# Patient Record
Sex: Female | Born: 1935 | Race: Black or African American | Hispanic: No | Smoking: Never smoker
Health system: Southern US, Community
[De-identification: ages and names within clinical notes are randomized; demographics above are authoritative.]

## PROBLEM LIST (undated history)

## (undated) DIAGNOSIS — M1990 Unspecified osteoarthritis, unspecified site: Secondary | ICD-10-CM

## (undated) DIAGNOSIS — Z8709 Personal history of other diseases of the respiratory system: Secondary | ICD-10-CM

## (undated) DIAGNOSIS — H409 Unspecified glaucoma: Secondary | ICD-10-CM

## (undated) DIAGNOSIS — K5732 Diverticulitis of large intestine without perforation or abscess without bleeding: Secondary | ICD-10-CM

## (undated) DIAGNOSIS — I10 Essential (primary) hypertension: Secondary | ICD-10-CM

## (undated) DIAGNOSIS — R32 Unspecified urinary incontinence: Secondary | ICD-10-CM

## (undated) DIAGNOSIS — J302 Other seasonal allergic rhinitis: Secondary | ICD-10-CM

## (undated) DIAGNOSIS — R3915 Urgency of urination: Secondary | ICD-10-CM

## (undated) HISTORY — PX: ABDOMINAL HYSTERECTOMY: SHX81

## (undated) HISTORY — PX: COLONOSCOPY: SHX174

## (undated) HISTORY — PX: SAVORY DILATION: SHX5439

## (undated) HISTORY — DX: Essential (primary) hypertension: I10

## (undated) HISTORY — PX: DILATION AND CURETTAGE OF UTERUS: SHX78

## (undated) HISTORY — DX: Diverticulitis of large intestine without perforation or abscess without bleeding: K57.32

---

## 2016-07-09 ENCOUNTER — Other Ambulatory Visit: Payer: Self-pay | Admitting: Orthopedic Surgery

## 2016-07-09 DIAGNOSIS — R0989 Other specified symptoms and signs involving the circulatory and respiratory systems: Secondary | ICD-10-CM

## 2016-07-17 ENCOUNTER — Ambulatory Visit
Admission: RE | Admit: 2016-07-17 | Discharge: 2016-07-17 | Disposition: A | Discharge status: Home / Self Care | Payer: Medicare Other | Source: Ambulatory Visit | Attending: Orthopedic Surgery | Admitting: Orthopedic Surgery

## 2016-07-17 DIAGNOSIS — R0989 Other specified symptoms and signs involving the circulatory and respiratory systems: Secondary | ICD-10-CM

## 2016-07-27 ENCOUNTER — Other Ambulatory Visit: Payer: Self-pay | Admitting: *Deleted

## 2016-07-27 DIAGNOSIS — I7092 Chronic total occlusion of artery of the extremities: Secondary | ICD-10-CM

## 2016-08-13 ENCOUNTER — Encounter: Payer: Self-pay | Admitting: Surgery

## 2016-08-15 ENCOUNTER — Encounter: Payer: Self-pay | Admitting: Surgery

## 2016-08-15 ENCOUNTER — Ambulatory Visit (INDEPENDENT_AMBULATORY_CARE_PROVIDER_SITE_OTHER): Payer: Medicare Other | Admitting: Surgery

## 2016-08-15 ENCOUNTER — Ambulatory Visit (HOSPITAL_COMMUNITY)
Admission: RE | Admit: 2016-08-15 | Discharge: 2016-08-15 | Disposition: A | Discharge status: Home / Self Care | Payer: Medicare Other | Source: Ambulatory Visit | Attending: Surgery | Admitting: Surgery

## 2016-08-15 VITALS — BP 138/63 | HR 57 | Temp 98.0°F | Resp 14 | Ht 62.0 in | Wt 130.0 lb

## 2016-08-15 DIAGNOSIS — R0989 Other specified symptoms and signs involving the circulatory and respiratory systems: Secondary | ICD-10-CM | POA: Diagnosis present

## 2016-08-15 DIAGNOSIS — I10 Essential (primary) hypertension: Secondary | ICD-10-CM | POA: Insufficient documentation

## 2016-08-15 DIAGNOSIS — I70213 Atherosclerosis of native arteries of extremities with intermittent claudication, bilateral legs: Secondary | ICD-10-CM | POA: Diagnosis not present

## 2016-08-15 DIAGNOSIS — I7092 Chronic total occlusion of artery of the extremities: Secondary | ICD-10-CM | POA: Diagnosis not present

## 2016-08-15 DIAGNOSIS — I70203 Unspecified atherosclerosis of native arteries of extremities, bilateral legs: Secondary | ICD-10-CM | POA: Insufficient documentation

## 2016-08-15 NOTE — Progress Notes (Signed)
[facility] [place]  Patient name: Darlene Torres MRN: [id number] DOB: 02/21/1929 Sex: female  REFERRING PHYSICIAN: Dr. Linna CapriceSwinteck  REASON FOR CONSULT: PAD  HPI: Darlene Torres is a 82 y.o. female, who is referred today for evaluation of decreased pulses.  The patient states that she has trouble with ambulation, however this is secondary to left hip and right knee pain.  She has been evaluated for possible hip replacement.  During this evaluation she was found to be without pedal pulses and therefore she was sent for vascular evaluation.  The patient denies symptoms of claudication.  She does not get any cramping with activity.  She denies rest pain which wakes her up at night.  She denies nonhealing wounds.  The patient suffers from hypertension which is medically managed with an ACE inhibitor.  She has been on aspirin in the past but has recently stopped taking this.  She is a nonsmoker.  Past Medical History:  Diagnosis Date  . Diverticulitis of colon   . Hypertension     No family history on file.  SOCIAL HISTORY: Social History   Social History  . Marital status: Widowed    Spouse name: N/A  . Number of children: N/A  . Years of education: N/A   Occupational History  . Not on file.   Social History Main Topics  . Smoking status: Never Smoker  . Smokeless tobacco: Never Used  . Alcohol use No  . Drug use: No  . Sexual activity: Not on file   Other Topics Concern  . Not on file   Social History Narrative  . No narrative on file    No Known Allergies  Current Outpatient Prescriptions  Medication Sig Dispense Refill  . Cholecalciferol (VITAMIN D PO) Take 50,000 Units by mouth once a week.    Marland Kitchen. lisinopril (PRINIVIL,ZESTRIL) 10 MG tablet Take 10 mg by mouth daily.    . meloxicam (MOBIC) 7.5 MG tablet Take 7.5 mg by mouth as needed for pain.    . metoprolol succinate (TOPROL-XL) 25 MG 24 hr tablet Take 25 mg by  mouth daily.    Marland Kitchen. oxybutynin (DITROPAN-XL) 5 MG 24 hr tablet Take 5 mg by mouth at bedtime.    . timolol (BETIMOL) 0.5 % ophthalmic solution 1 drop 2 (two) times daily.    . traMADol (ULTRAM) 50 MG tablet Take by mouth every 6 (six) hours as needed.     No current facility-administered medications for this visit.     REVIEW OF SYSTEMS:  [X]  denotes positive finding, [ ]  denotes negative finding Cardiac  Comments:  Chest pain or chest pressure:    Shortness of breath upon exertion:    Short of breath when lying flat:    Irregular heart rhythm:        Vascular    Pain in calf, thigh, or hip brought on by ambulation: x   Pain in feet at night that wakes you up from your sleep:     Blood clot in your veins:    Leg swelling:  x       Pulmonary    Oxygen at home:    Productive cough:     Wheezing:         Neurologic    Sudden weakness in arms or legs:  x   Sudden numbness in arms or legs:     Sudden onset of difficulty speaking or slurred speech:    Temporary loss of  vision in one eye:     Problems with dizziness:         Gastrointestinal    Blood in stool:     Vomited blood:         Genitourinary    Burning when urinating:     Blood in urine:        Psychiatric    Major depression:         Hematologic    Bleeding problems:    Problems with blood clotting too easily:        Skin    Rashes or ulcers:        Constitutional    Fever or chills:      PHYSICAL EXAM: Vitals:   08/15/16 1436  BP: 138/63  Pulse: (!) 57  Resp: 14  Temp: 98 F (36.7 C)  SpO2: 98%  Weight: 130 lb (59 kg)  Height: 5\' 2"  (1.575 m)    GENERAL: The patient is a well-nourished female, in no acute distress. The vital signs are documented above. CARDIAC: There is a regular rate and rhythm.  VASCULAR: Nonpalpable pedal pulses.  No carotid bruits. PULMONARY: There is good air exchange bilaterally without wheezing or rales. ABDOMEN: Soft and non-tender with normal pitched bowel sounds.   No pulsatile mass MUSCULOSKELETAL: There are no major deformities or cyanosis. NEUROLOGIC: No focal weakness or paresthesias are detected. SKIN: There are no ulcers or rashes noted. PSYCHIATRIC: The patient has a normal affect.  DATA:  ABIs were reviewed today.  0.67 on the right 0.73 on the left  ASSESSMENT AND PLAN: Atherosclerosis of the lower extremity: The patient despite having decreased ankle brachial indices and nonpalpable pedal pulses, does not have symptoms of vascular insufficiency.  Specifically, she does not endorse claudication symptoms and she does not have nonhealing wounds or lower extremity rest pain.  For that reason I would recommend medical management of her atherosclerotic disease.  This would include restarting her aspirin.  I discussed the possibility of starting a statin, however I will defer to her primary care physician to make this decision, given the patient's advanced age.  I would have no concerns proceeding with orthopedic surgery of the left hip and left knee.  The patient is at slightly increased risk for incisional complications particularly in the knee, however no vascular intervention would be recommended prior to her operation.  The patient will contact me if she develops any issues with wounds or progression of her symptoms.  I have her scheduled for follow-up in one year for surveillance with ankle-brachial indices.   Durene CalWells Hadlea Furuya, MD [facility] Tel [phone] Pager [phone]

## 2016-08-16 ENCOUNTER — Other Ambulatory Visit: Payer: Self-pay | Admitting: *Deleted

## 2016-08-16 DIAGNOSIS — I739 Peripheral vascular disease, unspecified: Secondary | ICD-10-CM

## 2016-09-03 NOTE — Progress Notes (Signed)
Patient scheduled for surgery on 09/20/2016.  preop being scheduled.  [facility]

## 2016-09-04 ENCOUNTER — Ambulatory Visit: Payer: Self-pay | Admitting: Orthopedic Surgery

## 2016-09-11 ENCOUNTER — Ambulatory Visit: Payer: Self-pay | Admitting: Orthopedic Surgery

## 2016-09-11 NOTE — Patient Instructions (Signed)
Darlene Torres  09/11/2016   Your procedure is scheduled on: Thursday 09/20/2016  Report to [facility] Main  Entrance take [facility]  elevators to 3rd floor to  Short Stay Center at   145 PM.  Call this number if you have problems the morning of surgery [phone]   Remember: ONLY 1 PERSON MAY GO WITH YOU TO SHORT STAY TO GET  READY MORNING OF YOUR SURGERY.       Do not eat food :After Midnight.  MAY HAVE CLEAR LIQUIDS FROM MIDNIGHT UP UNTIL 0945 AM THEN NOTHING UNTIL AFTER SURGERY!     CLEAR LIQUID DIET   Foods Allowed                                                                     Foods Excluded  Coffee and tea, regular and decaf                             liquids that you cannot  Plain Jell-O in any flavor                                             see through such as: Fruit ices (not with fruit pulp)                                     milk, soups, orange juice  Iced Popsicles                                    All solid food Carbonated beverages, regular and diet                                    Cranberry, grape and apple juices Sports drinks like Gatorade Lightly seasoned clear broth or consume(fat free) Sugar, honey syrup  Sample Menu Breakfast                                Lunch                                     Supper Cranberry juice                    Beef broth                            Chicken broth Jell-O                                     Grape juice  Apple juice Coffee or tea                        Jell-O                                      Popsicle                                                Coffee or tea                        Coffee or tea  _____________________________________________________________________     Take these medicines the morning of surgery with A SIP OF WATER: METOPROLOL, TIMOLOL EYE DROPS                                 You may not have any metal on your body including hair  pins and              piercings  Do not wear jewelry, make-up, lotions, powders or perfumes, deodorant             Do not wear nail polish.  Do not shave  48 hours prior to surgery.              Men may shave face and neck.   Do not bring valuables to the hospital. [facility] IS NOT             RESPONSIBLE   FOR VALUABLES.  Contacts, dentures or bridgework may not be worn into surgery.  Leave suitcase in the car. After surgery it may be brought to your room.                Please read over the following fact sheets you were given: _____________________________________________________________________             [facility] - Preparing for [facility] Before surgery, you can play an important role.  Because skin is not sterile, your skin needs to be as free of germs as possible.  You can reduce the number of germs on your skin by washing with CHG (chlorahexidine gluconate) soap before surgery.  CHG is an antiseptic cleaner which kills germs and bonds with the skin to continue killing germs even after washing. Please DO NOT use if you have an allergy to CHG or antibacterial soaps.  If your skin becomes reddened/irritated stop using the CHG and inform your nurse when you arrive at Short Stay. Do not shave (including legs and underarms) for at least 48 hours prior to the first CHG shower.  You may shave your face/neck. Please follow these instructions carefully:  1.  Shower with CHG Soap the night before surgery and the  morning of Surgery.  2.  If you choose to wash your hair, wash your hair first as usual with your  normal  shampoo.  3.  After you shampoo, rinse your hair and body thoroughly to remove the  shampoo.                           4.  Use CHG as you would  any other liquid soap.  You can apply chg directly  to the skin and wash                       Gently with a scrungie or clean washcloth.  5.  Apply the CHG Soap to your body ONLY FROM THE NECK DOWN.   Do not use on face/ open                            Wound or open sores. Avoid contact with eyes, ears mouth and genitals (private parts).                       Wash face,  Genitals (private parts) with your normal soap.             6.  Wash thoroughly, paying special attention to the area where your surgery  will be performed.  7.  Thoroughly rinse your body with warm water from the neck down.  8.  DO NOT shower/wash with your normal soap after using and rinsing off  the CHG Soap.                9.  Pat yourself dry with a clean towel.            10.  Wear clean pajamas.            11.  Place clean sheets on your bed the night of your first shower and do not  sleep with pets. Day of Surgery : Do not apply any lotions/deodorants the morning of surgery.  Please wear clean clothes to the hospital/surgery center.  FAILURE TO FOLLOW THESE INSTRUCTIONS MAY RESULT IN THE CANCELLATION OF YOUR SURGERY PATIENT SIGNATURE_________________________________  NURSE SIGNATURE__________________________________  ________________________________________________________________________   Rogelia MireIncentive Spirometer  An incentive spirometer is a tool that can help keep your lungs clear and active. This tool measures how well you are filling your lungs with each breath. Taking long deep breaths may help reverse or decrease the chance of developing breathing (pulmonary) problems (especially infection) following:  A long period of time when you are unable to move or be active. BEFORE THE PROCEDURE   If the spirometer includes an indicator to show your best effort, your nurse or respiratory therapist will set it to a desired goal.  If possible, sit up straight or lean slightly forward. Try not to slouch.  Hold the incentive spirometer in an upright position. INSTRUCTIONS FOR USE  1. Sit on the edge of your bed if possible, or sit up as far as you can in bed or on a chair. 2. Hold the incentive spirometer in an upright position. 3. Breathe out  normally. 4. Place the mouthpiece in your mouth and seal your lips tightly around it. 5. Breathe in slowly and as deeply as possible, raising the piston or the ball toward the top of the column. 6. Hold your breath for 3-5 seconds or for as long as possible. Allow the piston or ball to fall to the bottom of the column. 7. Remove the mouthpiece from your mouth and breathe out normally. 8. Rest for a few seconds and repeat Steps 1 through 7 at least 10 times every 1-2 hours when you are awake. Take your time and take a few normal breaths between deep breaths. 9. The spirometer may include an indicator to show your best effort. Use the indicator  as a goal to work toward during each repetition. 10. After each set of 10 deep breaths, practice coughing to be sure your lungs are clear. If you have an incision (the cut made at the time of surgery), support your incision when coughing by placing a pillow or rolled up towels firmly against it. Once you are able to get out of bed, walk around indoors and cough well. You may stop using the incentive spirometer when instructed by your caregiver.  RISKS AND COMPLICATIONS  Take your time so you do not get dizzy or light-headed.  If you are in pain, you may need to take or ask for pain medication before doing incentive spirometry. It is harder to take a deep breath if you are having pain. AFTER USE  Rest and breathe slowly and easily.  It can be helpful to keep track of a log of your progress. Your caregiver can provide you with a simple table to help with this. If you are using the spirometer at home, follow these instructions: SEEK MEDICAL CARE IF:   You are having difficultly using the spirometer.  You have trouble using the spirometer as often as instructed.  Your pain medication is not giving enough relief while using the spirometer.  You develop fever of 100.5 F (38.1 C) or higher. SEEK IMMEDIATE MEDICAL CARE IF:   You cough up bloody sputum  that had not been present before.  You develop fever of 102 F (38.9 C) or greater.  You develop worsening pain at or near the incision site. MAKE SURE YOU:   Understand these instructions.  Will watch your condition.  Will get help right away if you are not doing well or get worse. Document Released: 04/22/2007 Document Revised: 03/03/2012 Document Reviewed: 06/23/2007 ExitCare Patient Information 2014 ExitCare, [place].   ________________________________________________________________________  WHAT IS A BLOOD TRANSFUSION? Blood Transfusion Information  A transfusion is the replacement of blood or some of its parts. Blood is made up of multiple cells which provide different functions.  Red blood cells carry oxygen and are used for blood loss replacement.  White blood cells fight against infection.  Platelets control bleeding.  Plasma helps clot blood.  Other blood products are available for specialized needs, such as hemophilia or other clotting disorders. BEFORE THE TRANSFUSION  Who gives blood for transfusions?   Healthy volunteers who are fully evaluated to make sure their blood is safe. This is blood bank blood. Transfusion therapy is the safest it has ever been in the practice of medicine. Before blood is taken from a donor, a complete history is taken to make sure that person has no history of diseases nor engages in risky social behavior (examples are intravenous drug use or sexual activity with multiple partners). The donor's travel history is screened to minimize risk of transmitting infections, such as malaria. The donated blood is tested for signs of infectious diseases, such as HIV and hepatitis. The blood is then tested to be sure it is compatible with you in order to minimize the chance of a transfusion reaction. If you or a relative donates blood, this is often done in anticipation of surgery and is not appropriate for emergency situations. It takes many days to  process the donated blood. RISKS AND COMPLICATIONS Although transfusion therapy is very safe and saves many lives, the main dangers of transfusion include:   Getting an infectious disease.  Developing a transfusion reaction. This is an allergic reaction to something in the blood you were  given. Every precaution is taken to prevent this. The decision to have a blood transfusion has been considered carefully by your caregiver before blood is given. Blood is not given unless the benefits outweigh the risks. AFTER THE TRANSFUSION  Right after receiving a blood transfusion, you will usually feel much better and more energetic. This is especially true if your red blood cells have gotten low (anemic). The transfusion raises the level of the red blood cells which carry oxygen, and this usually causes an energy increase.  The nurse administering the transfusion will monitor you carefully for complications. HOME CARE INSTRUCTIONS  No special instructions are needed after a transfusion. You may find your energy is better. Speak with your caregiver about any limitations on activity for underlying diseases you may have. SEEK MEDICAL CARE IF:   Your condition is not improving after your transfusion.  You develop redness or irritation at the intravenous (IV) site. SEEK IMMEDIATE MEDICAL CARE IF:  Any of the following symptoms occur over the next 12 hours:  Shaking chills.  You have a temperature by mouth above 102 F (38.9 C), not controlled by medicine.  Chest, back, or muscle pain.  People around you feel you are not acting correctly or are confused.  Shortness of breath or difficulty breathing.  Dizziness and fainting.  You get a rash or develop hives.  You have a decrease in urine output.  Your urine turns a dark color or changes to pink, red, or brown. Any of the following symptoms occur over the next 10 days:  You have a temperature by mouth above 102 F (38.9 C), not controlled by  medicine.  Shortness of breath.  Weakness after normal activity.  The white part of the eye turns yellow (jaundice).  You have a decrease in the amount of urine or are urinating less often.  Your urine turns a dark color or changes to pink, red, or brown. Document Released: 12/07/2000 Document Revised: 03/03/2012 Document Reviewed: 07/26/2008 [facility] Patient Information 2014 [place], [place].  _______________________________________________________________________

## 2016-09-11 NOTE — H&P (Signed)
TOTAL HIP ADMISSION H&P  Patient is admitted for left total hip arthroplasty.  Subjective:  Chief Complaint: left hip pain  HPI: Eddie CandleMyrtle G Brinkman, 82 y.o. female, has a history of pain and functional disability in the left hip(s) due to arthritis and patient has failed non-surgical conservative treatments for greater than 12 weeks to include NSAID's and/or analgesics, flexibility and strengthening excercises, use of assistive devices, weight reduction as appropriate and activity modification.  Onset of symptoms was gradual starting 3 years ago with rapidlly worsening course since that time.The patient noted no past surgery on the left hip(s).  Patient currently rates pain in the left hip at 10 out of 10 with activity. Patient has night pain, worsening of pain with activity and weight bearing, pain that interfers with activities of daily living, pain with passive range of motion and crepitus. Patient has evidence of subchondral cysts, subchondral sclerosis, periarticular osteophytes and joint space narrowing by imaging studies. This condition presents safety issues increasing the risk of falls. There is no current active infection.  There are no active problems to display for this patient.  Past Medical History:  Diagnosis Date  . Diverticulitis of colon   . Hypertension     No past surgical history on file.   (Not in a hospital admission) No Known Allergies  Social History  Substance Use Topics  . Smoking status: Never Smoker  . Smokeless tobacco: Never Used  . Alcohol use No    No family history on file.   Review of Systems  Constitutional: Positive for diaphoresis.  HENT: Positive for tinnitus.   Eyes: Negative.   Cardiovascular: Negative.   Gastrointestinal: Negative.   Genitourinary: Positive for frequency.  Musculoskeletal: Positive for joint pain.  Skin: Positive for itching and rash.  Neurological: Negative.   Endo/Heme/Allergies: Negative.   Psychiatric/Behavioral:  Negative.     Objective:  Physical Exam  Vital signs in last 24 hours: @VSRANGES @  Labs:   Estimated body mass index is 23.78 kg/m as calculated from the following:   Height as of 08/15/16: 5\' 2"  (1.575 m).   Weight as of 08/15/16: 59 kg (130 lb).   Imaging Review Plain radiographs demonstrate severe degenerative joint disease of the left hip(s). The bone quality appears to be adequate for age and reported activity level.  Assessment/Plan:  End stage arthritis, left hip(s)  The patient history, physical examination, clinical judgement of the provider and imaging studies are consistent with end stage degenerative joint disease of the left hip(s) and total hip arthroplasty is deemed medically necessary. The treatment options including medical management, injection therapy, arthroscopy and arthroplasty were discussed at length. The risks and benefits of total hip arthroplasty were presented and reviewed. The risks due to aseptic loosening, infection, stiffness, dislocation/subluxation,  thromboembolic complications and other imponderables were discussed.  The patient acknowledged the explanation, agreed to proceed with the plan and consent was signed. Patient is being admitted for inpatient treatment for surgery, pain control, PT, OT, prophylactic antibiotics, VTE prophylaxis, progressive ambulation and ADL's and discharge planning.The patient is planning to be discharged home with home health services

## 2016-09-13 ENCOUNTER — Encounter (HOSPITAL_COMMUNITY): Payer: Self-pay

## 2016-09-13 ENCOUNTER — Encounter (HOSPITAL_COMMUNITY)
Admission: RE | Admit: 2016-09-13 | Discharge: 2016-09-13 | Disposition: A | Discharge status: Home / Self Care | Payer: Medicare Other | Source: Ambulatory Visit | Attending: Orthopedic Surgery | Admitting: Orthopedic Surgery

## 2016-09-13 DIAGNOSIS — Z0183 Encounter for blood typing: Secondary | ICD-10-CM | POA: Insufficient documentation

## 2016-09-13 DIAGNOSIS — Z01818 Encounter for other preprocedural examination: Secondary | ICD-10-CM | POA: Insufficient documentation

## 2016-09-13 DIAGNOSIS — M1612 Unilateral primary osteoarthritis, left hip: Secondary | ICD-10-CM | POA: Diagnosis not present

## 2016-09-13 DIAGNOSIS — R9431 Abnormal electrocardiogram [ECG] [EKG]: Secondary | ICD-10-CM | POA: Diagnosis not present

## 2016-09-13 DIAGNOSIS — I440 Atrioventricular block, first degree: Secondary | ICD-10-CM | POA: Insufficient documentation

## 2016-09-13 DIAGNOSIS — I10 Essential (primary) hypertension: Secondary | ICD-10-CM | POA: Diagnosis not present

## 2016-09-13 DIAGNOSIS — Z01812 Encounter for preprocedural laboratory examination: Secondary | ICD-10-CM | POA: Diagnosis not present

## 2016-09-13 DIAGNOSIS — R001 Bradycardia, unspecified: Secondary | ICD-10-CM | POA: Diagnosis not present

## 2016-09-13 HISTORY — DX: Unspecified osteoarthritis, unspecified site: M19.90

## 2016-09-13 LAB — CBC
HEMATOCRIT: 36.3 % (ref 36.0–46.0)
Hemoglobin: 11.8 g/dL — ABNORMAL LOW (ref 12.0–15.0)
MCH: 31 pg (ref 26.0–34.0)
MCHC: 32.5 g/dL (ref 30.0–36.0)
MCV: 95.3 fL (ref 78.0–100.0)
PLATELETS: 367 10*3/uL (ref 150–400)
RBC: 3.81 MIL/uL — AB (ref 3.87–5.11)
RDW: 13 % (ref 11.5–15.5)
WBC: 10.4 10*3/uL (ref 4.0–10.5)

## 2016-09-13 LAB — SURGICAL PCR SCREEN
MRSA, PCR: NEGATIVE
Staphylococcus aureus: POSITIVE — AB

## 2016-09-13 LAB — BASIC METABOLIC PANEL
Anion gap: 5 (ref 5–15)
BUN: 13 mg/dL (ref 6–20)
CHLORIDE: 107 mmol/L (ref 101–111)
CO2: 29 mmol/L (ref 22–32)
Calcium: 9.7 mg/dL (ref 8.9–10.3)
Creatinine, Ser: 0.94 mg/dL (ref 0.44–1.00)
GFR calc Af Amer: >60 mL/min (ref >60)
GFR calc non Af Amer: 54 mL/min — ABNORMAL LOW (ref >60)
Glucose, Bld: 96 mg/dL (ref 65–99)
POTASSIUM: 4.5 mmol/L (ref 3.5–5.1)
SODIUM: 141 mmol/L (ref 135–145)

## 2016-09-13 LAB — ABO/RH: ABO/RH(D): B POS

## 2016-09-20 ENCOUNTER — Inpatient Hospital Stay (HOSPITAL_COMMUNITY): Payer: Medicare Other

## 2016-09-20 ENCOUNTER — Encounter (HOSPITAL_COMMUNITY)
Admission: RE | Disposition: A | Discharge status: Home Health | Payer: Self-pay | Source: Ambulatory Visit | Attending: Orthopedic Surgery

## 2016-09-20 ENCOUNTER — Encounter (HOSPITAL_COMMUNITY): Payer: Self-pay | Admitting: *Deleted

## 2016-09-20 ENCOUNTER — Inpatient Hospital Stay (HOSPITAL_COMMUNITY): Payer: Medicare Other | Admitting: Certified Registered Nurse Anesthetist

## 2016-09-20 ENCOUNTER — Inpatient Hospital Stay (HOSPITAL_COMMUNITY)
Admission: RE | Admit: 2016-09-20 | Discharge: 2016-09-21 | DRG: 470 | Disposition: A | Discharge status: Home Health | Payer: Medicare Other | Source: Ambulatory Visit | Attending: Orthopedic Surgery | Admitting: Orthopedic Surgery

## 2016-09-20 DIAGNOSIS — I10 Essential (primary) hypertension: Secondary | ICD-10-CM | POA: Diagnosis present

## 2016-09-20 DIAGNOSIS — Z79899 Other long term (current) drug therapy: Secondary | ICD-10-CM | POA: Diagnosis not present

## 2016-09-20 DIAGNOSIS — Z419 Encounter for procedure for purposes other than remedying health state, unspecified: Secondary | ICD-10-CM

## 2016-09-20 DIAGNOSIS — Z09 Encounter for follow-up examination after completed treatment for conditions other than malignant neoplasm: Secondary | ICD-10-CM

## 2016-09-20 DIAGNOSIS — M1612 Unilateral primary osteoarthritis, left hip: Principal | ICD-10-CM | POA: Diagnosis present

## 2016-09-20 HISTORY — PX: TOTAL HIP ARTHROPLASTY: SHX124

## 2016-09-20 LAB — TYPE AND SCREEN
ABO/RH(D): B POS
Antibody Screen: NEGATIVE

## 2016-09-20 SURGERY — ARTHROPLASTY, HIP, TOTAL, ANTERIOR APPROACH
Anesthesia: Spinal | Site: Hip | Laterality: Left

## 2016-09-20 MED ORDER — DEXAMETHASONE SODIUM PHOSPHATE 10 MG/ML IJ SOLN
INTRAMUSCULAR | Status: DC | PRN
  Administered 2016-09-20: 10 mg via INTRAVENOUS

## 2016-09-20 MED ORDER — METOPROLOL SUCCINATE ER 25 MG PO TB24
25.0000 mg | ORAL_TABLET | Freq: Every day | ORAL | Status: DC
  Administered 2016-09-21: 25 mg via ORAL
  Filled 2016-09-20: qty 1

## 2016-09-20 MED ORDER — PROPOFOL 10 MG/ML IV BOLUS
INTRAVENOUS | Status: AC
  Filled 2016-09-20: qty 40

## 2016-09-20 MED ORDER — ACETAMINOPHEN 10 MG/ML IV SOLN
INTRAVENOUS | Status: AC
  Filled 2016-09-20: qty 100

## 2016-09-20 MED ORDER — CEFAZOLIN IN D5W 1 GM/50ML IV SOLN
1.0000 g | Freq: Four times a day (QID) | INTRAVENOUS | Status: AC
  Administered 2016-09-20 – 2016-09-21 (×2): 1 g via INTRAVENOUS
  Filled 2016-09-20 (×2): qty 50

## 2016-09-20 MED ORDER — LIDOCAINE 2% (20 MG/ML) 5 ML SYRINGE
INTRAMUSCULAR | Status: DC | PRN
  Administered 2016-09-20: 50 mg via INTRAVENOUS

## 2016-09-20 MED ORDER — FENTANYL CITRATE (PF) 100 MCG/2ML IJ SOLN
INTRAMUSCULAR | Status: AC
  Filled 2016-09-20: qty 2

## 2016-09-20 MED ORDER — SODIUM CHLORIDE 0.9 % IR SOLN
Status: DC | PRN
  Administered 2016-09-20: 3000 mL
  Administered 2016-09-20: 1000 mL

## 2016-09-20 MED ORDER — MENTHOL 3 MG MT LOZG: 1.0000 | LOZENGE | OROMUCOSAL | Status: DC | PRN

## 2016-09-20 MED ORDER — HYDROCODONE-ACETAMINOPHEN 5-325 MG PO TABS
1.0000 | ORAL_TABLET | ORAL | Status: DC | PRN
  Administered 2016-09-20 – 2016-09-21 (×4): 1 via ORAL
  Filled 2016-09-20 (×4): qty 1

## 2016-09-20 MED ORDER — METHOCARBAMOL 500 MG PO TABS: 500.0000 mg | ORAL_TABLET | Freq: Four times a day (QID) | ORAL | Status: DC | PRN

## 2016-09-20 MED ORDER — ONDANSETRON HCL 4 MG PO TABS: 4.0000 mg | ORAL_TABLET | Freq: Four times a day (QID) | ORAL | Status: DC | PRN

## 2016-09-20 MED ORDER — TIMOLOL HEMIHYDRATE 0.5 % OP SOLN: 1.0000 [drp] | Freq: Two times a day (BID) | OPHTHALMIC | Status: DC

## 2016-09-20 MED ORDER — KETOROLAC TROMETHAMINE 30 MG/ML IJ SOLN
INTRAMUSCULAR | Status: AC
  Filled 2016-09-20: qty 1

## 2016-09-20 MED ORDER — METOCLOPRAMIDE HCL 5 MG/ML IJ SOLN: 5.0000 mg | Freq: Three times a day (TID) | INTRAMUSCULAR | Status: DC | PRN

## 2016-09-20 MED ORDER — ASPIRIN 81 MG PO CHEW
81.0000 mg | CHEWABLE_TABLET | Freq: Two times a day (BID) | ORAL | Status: DC
  Administered 2016-09-21: 81 mg via ORAL
  Filled 2016-09-20: qty 1

## 2016-09-20 MED ORDER — ACETAMINOPHEN 650 MG RE SUPP: 650.0000 mg | Freq: Four times a day (QID) | RECTAL | Status: DC | PRN

## 2016-09-20 MED ORDER — SODIUM CHLORIDE 0.9 % IV SOLN
INTRAVENOUS | Status: DC
  Administered 2016-09-21: 03:00:00 via INTRAVENOUS

## 2016-09-20 MED ORDER — SODIUM CHLORIDE 0.9 % IJ SOLN
INTRAMUSCULAR | Status: AC
  Filled 2016-09-20: qty 50

## 2016-09-20 MED ORDER — PHENYLEPHRINE HCL 10 MG/ML IJ SOLN
INTRAMUSCULAR | Status: AC
  Filled 2016-09-20: qty 1

## 2016-09-20 MED ORDER — OXYBUTYNIN CHLORIDE ER 5 MG PO TB24
5.0000 mg | ORAL_TABLET | Freq: Every day | ORAL | Status: DC
  Administered 2016-09-20: 5 mg via ORAL
  Filled 2016-09-20: qty 1

## 2016-09-20 MED ORDER — BUPIVACAINE HCL (PF) 0.5 % IJ SOLN
INTRAMUSCULAR | Status: DC | PRN
  Administered 2016-09-20: 3 mL

## 2016-09-20 MED ORDER — ACETAMINOPHEN 10 MG/ML IV SOLN
INTRAVENOUS | Status: DC | PRN
  Administered 2016-09-20: 1000 mg via INTRAVENOUS

## 2016-09-20 MED ORDER — TRANEXAMIC ACID 1000 MG/10ML IV SOLN
1000.0000 mg | INTRAVENOUS | Status: AC
  Administered 2016-09-20: 1000 mg via INTRAVENOUS
  Filled 2016-09-20: qty 10

## 2016-09-20 MED ORDER — METHOCARBAMOL 1000 MG/10ML IJ SOLN
500.0000 mg | Freq: Four times a day (QID) | INTRAVENOUS | Status: DC | PRN
  Filled 2016-09-20: qty 5

## 2016-09-20 MED ORDER — ONDANSETRON HCL 4 MG/2ML IJ SOLN: 4.0000 mg | Freq: Four times a day (QID) | INTRAMUSCULAR | Status: DC | PRN

## 2016-09-20 MED ORDER — METOCLOPRAMIDE HCL 5 MG PO TABS: 5.0000 mg | ORAL_TABLET | Freq: Three times a day (TID) | ORAL | Status: DC | PRN

## 2016-09-20 MED ORDER — POVIDONE-IODINE 10 % EX SWAB
2.0000 "application " | Freq: Once | CUTANEOUS | Status: AC
  Administered 2016-09-20: 2 via TOPICAL

## 2016-09-20 MED ORDER — CHLORHEXIDINE GLUCONATE 4 % EX LIQD: 60.0000 mL | Freq: Once | CUTANEOUS | Status: DC

## 2016-09-20 MED ORDER — ONDANSETRON HCL 4 MG/2ML IJ SOLN
INTRAMUSCULAR | Status: DC | PRN
  Administered 2016-09-20: 4 mg via INTRAVENOUS

## 2016-09-20 MED ORDER — CEFAZOLIN SODIUM-DEXTROSE 2-4 GM/100ML-% IV SOLN
INTRAVENOUS | Status: AC
  Filled 2016-09-20: qty 100

## 2016-09-20 MED ORDER — SENNA 8.6 MG PO TABS
2.0000 | ORAL_TABLET | Freq: Every day | ORAL | Status: DC
  Administered 2016-09-20: 17.2 mg via ORAL
  Filled 2016-09-20: qty 2

## 2016-09-20 MED ORDER — SODIUM CHLORIDE 0.9 % IV SOLN
1000.0000 mg | Freq: Once | INTRAVENOUS | Status: AC
  Administered 2016-09-20: 1000 mg via INTRAVENOUS
  Filled 2016-09-20: qty 10

## 2016-09-20 MED ORDER — CEFAZOLIN SODIUM-DEXTROSE 2-4 GM/100ML-% IV SOLN
2.0000 g | INTRAVENOUS | Status: AC
  Administered 2016-09-20: 2 g via INTRAVENOUS

## 2016-09-20 MED ORDER — BUPIVACAINE-EPINEPHRINE 0.5% -1:200000 IJ SOLN
INTRAMUSCULAR | Status: DC | PRN
  Administered 2016-09-20: 30 mL

## 2016-09-20 MED ORDER — LISINOPRIL 10 MG PO TABS
10.0000 mg | ORAL_TABLET | Freq: Every day | ORAL | Status: DC
  Administered 2016-09-21: 10 mg via ORAL
  Filled 2016-09-20: qty 1

## 2016-09-20 MED ORDER — DEXAMETHASONE SODIUM PHOSPHATE 10 MG/ML IJ SOLN
INTRAMUSCULAR | Status: AC
  Filled 2016-09-20: qty 1

## 2016-09-20 MED ORDER — HYDROGEN PEROXIDE 3 % EX SOLN
CUTANEOUS | Status: DC | PRN
  Administered 2016-09-20: 1

## 2016-09-20 MED ORDER — ISOPROPYL ALCOHOL 70 % SOLN
Status: DC | PRN
  Administered 2016-09-20: 1 via TOPICAL

## 2016-09-20 MED ORDER — POLYETHYLENE GLYCOL 3350 17 G PO PACK: 17.0000 g | PACK | Freq: Every day | ORAL | Status: DC | PRN

## 2016-09-20 MED ORDER — DEXAMETHASONE SODIUM PHOSPHATE 10 MG/ML IJ SOLN
10.0000 mg | Freq: Once | INTRAMUSCULAR | Status: AC
  Administered 2016-09-21: 10 mg via INTRAVENOUS
  Filled 2016-09-20: qty 1

## 2016-09-20 MED ORDER — BUPIVACAINE HCL (PF) 0.5 % IJ SOLN
INTRAMUSCULAR | Status: AC
  Filled 2016-09-20: qty 30

## 2016-09-20 MED ORDER — PHENOL 1.4 % MT LIQD: 1.0000 | OROMUCOSAL | Status: DC | PRN

## 2016-09-20 MED ORDER — ONDANSETRON HCL 4 MG/2ML IJ SOLN
INTRAMUSCULAR | Status: AC
  Filled 2016-09-20: qty 2

## 2016-09-20 MED ORDER — SODIUM CHLORIDE 0.9 % IV SOLN: INTRAVENOUS | Status: DC

## 2016-09-20 MED ORDER — BUPIVACAINE-EPINEPHRINE 0.5% -1:200000 IJ SOLN
INTRAMUSCULAR | Status: AC
  Filled 2016-09-20: qty 1

## 2016-09-20 MED ORDER — FENTANYL CITRATE (PF) 100 MCG/2ML IJ SOLN
INTRAMUSCULAR | Status: DC | PRN
  Administered 2016-09-20 (×2): 50 ug via INTRAVENOUS

## 2016-09-20 MED ORDER — DEXTROSE 5 % IV SOLN
INTRAVENOUS | Status: DC | PRN
  Administered 2016-09-20: 10 ug/min via INTRAVENOUS

## 2016-09-20 MED ORDER — PROPOFOL 500 MG/50ML IV EMUL
INTRAVENOUS | Status: DC | PRN
  Administered 2016-09-20 (×2): 50 ug/kg/min via INTRAVENOUS

## 2016-09-20 MED ORDER — HYDROGEN PEROXIDE 3 % EX SOLN
CUTANEOUS | Status: AC
  Filled 2016-09-20: qty 473

## 2016-09-20 MED ORDER — LORATADINE 10 MG PO TABS
10.0000 mg | ORAL_TABLET | Freq: Every day | ORAL | Status: DC
  Administered 2016-09-21: 10 mg via ORAL
  Filled 2016-09-20: qty 1

## 2016-09-20 MED ORDER — PROPOFOL 10 MG/ML IV BOLUS
INTRAVENOUS | Status: AC
  Filled 2016-09-20: qty 20

## 2016-09-20 MED ORDER — KETOROLAC TROMETHAMINE 30 MG/ML IJ SOLN
INTRAMUSCULAR | Status: DC | PRN
  Administered 2016-09-20: 30 mg

## 2016-09-20 MED ORDER — SODIUM CHLORIDE 0.9 % IJ SOLN
INTRAMUSCULAR | Status: DC | PRN
  Administered 2016-09-20: 30 mL

## 2016-09-20 MED ORDER — ISOPROPYL ALCOHOL 70 % SOLN
Status: AC
  Filled 2016-09-20: qty 480

## 2016-09-20 MED ORDER — LIDOCAINE 2% (20 MG/ML) 5 ML SYRINGE
INTRAMUSCULAR | Status: AC
  Filled 2016-09-20: qty 5

## 2016-09-20 MED ORDER — LACTATED RINGERS IV SOLN
INTRAVENOUS | Status: DC
  Administered 2016-09-20 (×3): via INTRAVENOUS

## 2016-09-20 MED ORDER — ACETAMINOPHEN 10 MG/ML IV SOLN: 1000.0000 mg | INTRAVENOUS | Status: DC

## 2016-09-20 MED ORDER — KETOROLAC TROMETHAMINE 15 MG/ML IJ SOLN
7.5000 mg | Freq: Four times a day (QID) | INTRAMUSCULAR | Status: DC
  Administered 2016-09-21 (×2): 7.5 mg via INTRAVENOUS
  Filled 2016-09-20 (×2): qty 1

## 2016-09-20 MED ORDER — ACETAMINOPHEN 325 MG PO TABS: 650.0000 mg | ORAL_TABLET | Freq: Four times a day (QID) | ORAL | Status: DC | PRN

## 2016-09-20 MED ORDER — WATER FOR IRRIGATION, STERILE IR SOLN
Status: DC | PRN
  Administered 2016-09-20: 2000 mL

## 2016-09-20 MED ORDER — DOCUSATE SODIUM 100 MG PO CAPS
100.0000 mg | ORAL_CAPSULE | Freq: Two times a day (BID) | ORAL | Status: DC
  Administered 2016-09-20 – 2016-09-21 (×2): 100 mg via ORAL
  Filled 2016-09-20 (×2): qty 1

## 2016-09-20 MED ORDER — TIMOLOL MALEATE 0.5 % OP SOLN
1.0000 [drp] | Freq: Two times a day (BID) | OPHTHALMIC | Status: DC
  Administered 2016-09-21: 1 [drp] via OPHTHALMIC
  Filled 2016-09-20: qty 5

## 2016-09-20 SURGICAL SUPPLY — 42 items
BAG DECANTER FOR FLEXI CONT (MISCELLANEOUS) ×3 IMPLANT
BAG ZIPLOCK 12X15 (MISCELLANEOUS) ×3 IMPLANT
CAPT HIP TOTAL 2 ×3 IMPLANT
CHLORAPREP W/TINT 26ML (MISCELLANEOUS) ×3 IMPLANT
CLOTH BEACON ORANGE TIMEOUT ST (SAFETY) ×3 IMPLANT
COVER PERINEAL POST (MISCELLANEOUS) ×3 IMPLANT
DECANTER SPIKE VIAL GLASS SM (MISCELLANEOUS) ×3 IMPLANT
DRAPE SHEET LG 3/4 BI-LAMINATE (DRAPES) ×6 IMPLANT
DRAPE STERI IOBAN 125X83 (DRAPES) ×3 IMPLANT
DRAPE U-SHAPE 47X51 STRL (DRAPES) ×6 IMPLANT
DRESSING AQUACEL AG SP 3.5X10 (GAUZE/BANDAGES/DRESSINGS) ×1 IMPLANT
DRSG AQUACEL AG ADV 3.5X10 (GAUZE/BANDAGES/DRESSINGS) ×3 IMPLANT
DRSG AQUACEL AG SP 3.5X10 (GAUZE/BANDAGES/DRESSINGS) ×3
ELECT PENCIL ROCKER SW 15FT (MISCELLANEOUS) ×3 IMPLANT
ELECT REM PT RETURN 15FT ADLT (MISCELLANEOUS) ×3 IMPLANT
GAUZE SPONGE 4X4 12PLY STRL (GAUZE/BANDAGES/DRESSINGS) ×3 IMPLANT
GLOVE BIO SURGEON STRL SZ8.5 (GLOVE) ×6 IMPLANT
GLOVE BIOGEL PI IND STRL 8.5 (GLOVE) ×1 IMPLANT
GLOVE BIOGEL PI INDICATOR 8.5 (GLOVE) ×2
GOWN SPEC L3 XXLG W/TWL (GOWN DISPOSABLE) ×3 IMPLANT
HANDPIECE INTERPULSE COAX TIP (DISPOSABLE) ×2
HOLDER FOLEY CATH W/STRAP (MISCELLANEOUS) ×3 IMPLANT
HOOD PEEL AWAY FLYTE STAYCOOL (MISCELLANEOUS) ×6 IMPLANT
LIQUID BAND (GAUZE/BANDAGES/DRESSINGS) ×3 IMPLANT
MARKER SKIN DUAL TIP RULER LAB (MISCELLANEOUS) ×3 IMPLANT
NEEDLE SPNL 18GX3.5 QUINCKE PK (NEEDLE) ×3 IMPLANT
PACK ANTERIOR HIP CUSTOM (KITS) ×3 IMPLANT
SAW OSC TIP CART 19.5X105X1.3 (SAW) ×3 IMPLANT
SEALER BIPOLAR AQUA 6.0 (INSTRUMENTS) ×3 IMPLANT
SET HNDPC FAN SPRY TIP SCT (DISPOSABLE) ×1 IMPLANT
SOL PREP POV-IOD 4OZ 10% (MISCELLANEOUS) ×3 IMPLANT
SUT ETHIBOND NAB CT1 #1 30IN (SUTURE) ×6 IMPLANT
SUT MNCRL AB 3-0 PS2 18 (SUTURE) ×3 IMPLANT
SUT MON AB 2-0 CT1 36 (SUTURE) ×6 IMPLANT
SUT STRATAFIX PDO 1 14 VIOLET (SUTURE) ×2
SUT STRATFX PDO 1 14 VIOLET (SUTURE) ×1
SUT VIC AB 2-0 CT1 27 (SUTURE) ×2
SUT VIC AB 2-0 CT1 TAPERPNT 27 (SUTURE) ×1 IMPLANT
SUTURE STRATFX PDO 1 14 VIOLET (SUTURE) ×1 IMPLANT
SYR 50ML LL SCALE MARK (SYRINGE) ×3 IMPLANT
WATER STERILE IRR 1500ML POUR (IV SOLUTION) ×6 IMPLANT
YANKAUER SUCT BULB TIP 10FT TU (MISCELLANEOUS) ×6 IMPLANT

## 2016-09-20 NOTE — Interval H&P Note (Signed)
History and Physical Interval Note:  09/20/2016 3:59 PM  Darlene Torres  has presented today for surgery, with the diagnosis of DJD Left Hip  The various methods of treatment have been discussed with the patient and family. After consideration of risks, benefits and other options for treatment, the patient has consented to  Procedure(s) with comments: LEFT TOTAL HIP ARTHROPLASTY ANTERIOR APPROACH (Left) - RNFA as a surgical intervention .  The patient's history has been reviewed, patient examined, no change in status, stable for surgery.  I have reviewed the patient's chart and labs.  Questions were answered to the patient's satisfaction.     Abdulwahab Demelo, Cloyde ReamsBrian James

## 2016-09-20 NOTE — Anesthesia Preprocedure Evaluation (Addendum)
Anesthesia Evaluation  Patient identified by MRN, date of birth, ID band Patient awake    Reviewed: Allergy & Precautions, NPO status , Patient's Chart, lab work & pertinent test results, reviewed documented beta blocker date and time   Airway Mallampati: II  TM Distance: >3 FB Neck ROM: Full    Dental no notable dental hx.    Pulmonary neg pulmonary ROS,    Pulmonary exam normal breath sounds clear to auscultation       Cardiovascular hypertension, Pt. on home beta blockers and Pt. on medications Normal cardiovascular exam Rhythm:Regular Rate:Normal     Neuro/Psych negative neurological ROS  negative psych ROS   GI/Hepatic negative GI ROS, Neg liver ROS,   Endo/Other  negative endocrine ROS  Renal/GU negative Renal ROS     Musculoskeletal  (+) Arthritis ,   Abdominal   Peds  Hematology negative hematology ROS (+)   Anesthesia Other Findings   Reproductive/Obstetrics negative OB ROS                            Anesthesia Physical Anesthesia Plan  ASA: II  Anesthesia Plan:    Post-op Pain Management:    Induction:   Airway Management Planned:   Additional Equipment:   Intra-op Plan:   Post-operative Plan:   Informed Consent: I have reviewed the patients History and Physical, chart, labs and discussed the procedure including the risks, benefits and alternatives for the proposed anesthesia with the patient or authorized representative who has indicated his/her understanding and acceptance.   Dental advisory given  Plan Discussed with: CRNA  Anesthesia Plan Comments:         Anesthesia Quick Evaluation

## 2016-09-20 NOTE — Transfer of Care (Signed)
Immediate Anesthesia Transfer of Care Note  Patient: Darlene Torres  Procedure(s) Performed: Procedure(s) with comments: LEFT TOTAL HIP ARTHROPLASTY ANTERIOR APPROACH (Left) - RNFA  Patient Location: PACU  Anesthesia Type:Spinal  Level of Consciousness: awake, alert  and oriented  Airway & Oxygen Therapy: Patient Spontanous Breathing and Patient connected to face mask oxygen  Post-op Assessment: Report given to RN and Post -op Vital signs reviewed and stable  Post vital signs: Reviewed and stable  Last Vitals:  Vitals:   09/20/16 1409 09/20/16 1413  BP:  (!) 192/62  Pulse: 62   Resp: 18   Temp: 36.9 C     Last Pain:  Vitals:   09/20/16 1409  TempSrc: Oral  PainSc:       Patients Stated Pain Goal: 3 (09/20/16 1407)  Complications: No apparent anesthesia complications

## 2016-09-20 NOTE — Anesthesia Procedure Notes (Addendum)
Spinal  Patient location during procedure: OR End time: 09/20/2016 4:21 PM Staffing Resident/CRNA: Enrigue Catena E Performed: anesthesiologist and resident/CRNA  Preanesthetic Checklist Completed: patient identified, site marked, surgical consent, pre-op evaluation, timeout performed, IV checked, risks and benefits discussed and monitors and equipment checked Spinal Block Patient position: sitting Prep: Betadine Patient monitoring: heart rate, continuous pulse ox and blood pressure Location: L3-4 Injection technique: single-shot Needle Needle type: Pencan  Needle gauge: 25 G Needle length: 9 cm Additional Notes Expiration date of kit checked and confirmed. Patient tolerated procedure well, without complications.

## 2016-09-20 NOTE — Op Note (Signed)
OPERATIVE REPORT  SURGEON: Samson FredericBrian Abriel Hattery, MD   ASSISTANT: None.  PREOPERATIVE DIAGNOSIS: Left hip arthritis.   POSTOPERATIVE DIAGNOSIS: Left hip arthritis.   PROCEDURE: Left total hip arthroplasty, anterior approach.   IMPLANTS: DePuy Tri Lock stem, size 1, std offset. DePuy Pinnacle Cup, size 50 mm. DePuy Altrx liner, size 32 by 50 mm, neutral. DePuy Biolox ceramic head ball, size 32 + 5 mm.  ANESTHESIA:  Spinal  ESTIMATED BLOOD LOSS: 300 mL.  ANTIBIOTICS: 2 g Ancef.  DRAINS: None.  COMPLICATIONS: None.   CONDITION: PACU - hemodynamically stable.Marland Kitchen.   BRIEF CLINICAL NOTE: Darlene Torres is a 82 y.o. female with a long-standing history of Left hip arthritis. After failing conservative management, the patient was indicated for total hip arthroplasty. The risks, benefits, and alternatives to the procedure were explained, and the patient elected to proceed.  PROCEDURE IN DETAIL: Surgical site was marked by myself. Spinal anesthesia was obtained in the pre-op holding area. Once inside the operative room, a foley catheter was inserted. The patient was then positioned on the Hana table. All bony prominences were well padded. The hip was prepped and draped in the normal sterile surgical fashion. A time-out was called verifying side and site of surgery. The patient received IV antibiotics within 60 minutes of beginning the procedure.  The direct anterior approach to the hip was performed through the Hueter interval. Lateral femoral circumflex vessels were treated with the Auqumantys. The anterior capsule was exposed and an inverted T capsulotomy was made.The femoral neck cut was made to the level of the templated cut. A corkscrew was placed into the head and the head was removed. The femoral head was found to have eburnated bone. The head was passed to the back table and was measured.  Acetabular exposure was achieved, and the pulvinar and labrum were excised. Sequental  reaming of the acetabulum was then performed up to a size 49 mm reamer. A 50 mm cup was then opened and impacted into place at approximately 40 degrees of abduction and 20 degrees of anteversion. The final polyethylene liner was impacted into place and acetabular osteophytes were removed.   I then gained femoral exposure taking care to protect the abductors and greater trochanter. This was performed using standard external rotation, extension, and adduction. The capsule was peeled off the inner aspect of the greater trochanter, taking care to preserve the short external rotators. A cookie cutter was used to enter the femoral canal, and then the femoral canal finder was placed. Sequential broaching was performed up to a size 1. Calcar planer was used on the femoral neck remnant. I placed a std offset neck and a trial head ball. The hip was reduced. Leg lengths and offset were checked fluoroscopically. The hip was dislocated and trial components were removed. The final implants were placed, and the hip was reduced.  Fluoroscopy was used to confirm component position and leg lengths. At 90 degrees of external rotation and full extension, the hip was stable to an anterior directed force.  The wound was copiously irrigated with a dilute betadine solution followed by normal saline. Marcaine solution was injected into the periarticular soft tissue. The wound was closed in layers using #1 Vicryl and V-Loc for the fascia, 2-0 Vicryl for the subcutaneous fat, 2-0 Monocryl for the deep dermal layer, 3-0 running Monocryl subcuticular stitch, and Dermabond for the skin. Once the glue was fully dried, an Aquacell Ag dressing was applied. The patient was transported to the recovery room in  stable condition. Sponge, needle, and instrument counts were correct at the end of the case x2. The patient tolerated the procedure well and there were no known complications.

## 2016-09-20 NOTE — Discharge Instructions (Signed)
°Dr. Luba Matzen °Joint Replacement Specialist °[facility] °[address] °[place], [place] [address] °[phone] ° ° °TOTAL HIP REPLACEMENT POSTOPERATIVE DIRECTIONS ° ° ° °Hip Rehabilitation, Guidelines Following Surgery  ° °WEIGHT BEARING °Weight bearing as tolerated with assist device (walker, cane, etc) as directed, use it as long as suggested by your surgeon or therapist, typically at least 4-6 weeks. ° °The results of a hip operation are greatly improved after range of motion and muscle strengthening exercises. Follow all safety measures which are given to protect your hip. If any of these exercises cause increased pain or swelling in your joint, decrease the amount until you are comfortable again. Then slowly increase the exercises. Call your caregiver if you have problems or questions.  ° °HOME CARE INSTRUCTIONS  °Most of the following instructions are designed to prevent the dislocation of your new hip.  °Remove items at home which could result in a fall. This includes throw rugs or furniture in walking pathways.  °Continue medications as instructed at time of discharge. °· You may have some home medications which will be placed on hold until you complete the course of blood thinner medication. °· You may start showering once you are discharged home. Do not remove your dressing. °Do not put on socks or shoes without following the instructions of your caregivers.   °Sit on chairs with arms. Use the chair arms to help push yourself up when arising.  °Arrange for the use of a toilet seat elevator so you are not sitting low.  °· Walk with walker as instructed.  °You may resume a sexual relationship in one month or when given the OK by your caregiver.  °Use walker as long as suggested by your caregivers.  °You may put full weight on your legs and walk as much as is comfortable. °Avoid periods of inactivity such as sitting longer than an hour when not asleep. This helps prevent  blood clots.  °You may return to work once you are cleared by your surgeon.  °Do not drive a car for 6 weeks or until released by your surgeon.  °Do not drive while taking narcotics.  °Wear elastic stockings for two weeks following surgery during the day but you may remove then at night.  °Make sure you keep all of your appointments after your operation with all of your doctors and caregivers. You should call the office at the above phone number and make an appointment for approximately two weeks after the date of your surgery. °Please pick up a stool softener and laxative for home use as long as you are requiring pain medications. °· ICE to the affected hip every three hours for 30 minutes at a time and then as needed for pain and swelling. Continue to use ice on the hip for pain and swelling from surgery. You may notice swelling that will progress down to the foot and ankle.  This is normal after surgery.  Elevate the leg when you are not up walking on it.   °It is important for you to complete the blood thinner medication as prescribed by your doctor. °· Continue to use the breathing machine which will help keep your temperature down.  It is common for your temperature to cycle up and down following surgery, especially at night when you are not up moving around and exerting yourself.  The breathing machine keeps your lungs expanded and your temperature down. ° °RANGE OF MOTION AND STRENGTHENING EXERCISES  °These exercises are   designed to help you keep full movement of your hip joint. Follow your caregiver's or physical therapist's instructions. Perform all exercises about fifteen times, three times per day or as directed. Exercise both hips, even if you have had only one joint replacement. These exercises can be done on a training (exercise) mat, on the floor, on a table or on a bed. Use whatever works the best and is most comfortable for you. Use music or television while you are exercising so that the exercises  are a pleasant break in your day. This will make your life better with the exercises acting as a break in routine you can look forward to.  °Lying on your back, slowly slide your foot toward your buttocks, raising your knee up off the floor. Then slowly slide your foot back down until your leg is straight again.  °Lying on your back spread your legs as far apart as you can without causing discomfort.  °Lying on your side, raise your upper leg and foot straight up from the floor as far as is comfortable. Slowly lower the leg and repeat.  °Lying on your back, tighten up the muscle in the front of your thigh (quadriceps muscles). You can do this by keeping your leg straight and trying to raise your heel off the floor. This helps strengthen the largest muscle supporting your knee.  °Lying on your back, tighten up the muscles of your buttocks both with the legs straight and with the knee bent at a comfortable angle while keeping your heel on the floor.  ° °SKILLED REHAB INSTRUCTIONS: °If the patient is transferred to a skilled rehab facility following release from the hospital, a list of the current medications will be sent to the facility for the patient to continue.  When discharged from the skilled rehab facility, please have the facility set up the patient's Home Health Physical Therapy prior to being released. Also, the skilled facility will be responsible for providing the patient with their medications at time of release from the facility to include their pain medication and their blood thinner medication. If the patient is still at the rehab facility at time of the two week follow up appointment, the skilled rehab facility will also need to assist the patient in arranging follow up appointment in our office and any transportation needs. ° °MAKE SURE YOU:  °Understand these instructions.  °Will watch your condition.  °Will get help right away if you are not doing well or get worse. ° °Pick up stool softner and  laxative for home use following surgery while on pain medications. °Do not remove your dressing. °The dressing is waterproof--it is OK to take showers. °Continue to use ice for pain and swelling after surgery. °Do not use any lotions or creams on the incision until instructed by your surgeon. °Total Hip Protocol. ° ° °

## 2016-09-20 NOTE — H&P (View-Only) (Signed)
TOTAL HIP ADMISSION H&P  Patient is admitted for left total hip arthroplasty.  Subjective:  Chief Complaint: left hip pain  HPI: Darlene Torres, 82 y.o. female, has a history of pain and functional disability in the left hip(s) due to arthritis and patient has failed non-surgical conservative treatments for greater than 12 weeks to include NSAID's and/or analgesics, flexibility and strengthening excercises, use of assistive devices, weight reduction as appropriate and activity modification.  Onset of symptoms was gradual starting 3 years ago with rapidlly worsening course since that time.The patient noted no past surgery on the left hip(s).  Patient currently rates pain in the left hip at 10 out of 10 with activity. Patient has night pain, worsening of pain with activity and weight bearing, pain that interfers with activities of daily living, pain with passive range of motion and crepitus. Patient has evidence of subchondral cysts, subchondral sclerosis, periarticular osteophytes and joint space narrowing by imaging studies. This condition presents safety issues increasing the risk of falls. There is no current active infection.  There are no active problems to display for this patient.  Past Medical History:  Diagnosis Date  . Diverticulitis of colon   . Hypertension     No past surgical history on file.   (Not in a hospital admission) No Known Allergies  Social History  Substance Use Topics  . Smoking status: Never Smoker  . Smokeless tobacco: Never Used  . Alcohol use No    No family history on file.   Review of Systems  Constitutional: Positive for diaphoresis.  HENT: Positive for tinnitus.   Eyes: Negative.   Cardiovascular: Negative.   Gastrointestinal: Negative.   Genitourinary: Positive for frequency.  Musculoskeletal: Positive for joint pain.  Skin: Positive for itching and rash.  Neurological: Negative.   Endo/Heme/Allergies: Negative.   Psychiatric/Behavioral:  Negative.     Objective:  Physical Exam  Vital signs in last 24 hours: @VSRANGES@  Labs:   Estimated body mass index is 23.78 kg/m as calculated from the following:   Height as of 08/15/16: 5' 2" (1.575 m).   Weight as of 08/15/16: 59 kg (130 lb).   Imaging Review Plain radiographs demonstrate severe degenerative joint disease of the left hip(s). The bone quality appears to be adequate for age and reported activity level.  Assessment/Plan:  End stage arthritis, left hip(s)  The patient history, physical examination, clinical judgement of the provider and imaging studies are consistent with end stage degenerative joint disease of the left hip(s) and total hip arthroplasty is deemed medically necessary. The treatment options including medical management, injection therapy, arthroscopy and arthroplasty were discussed at length. The risks and benefits of total hip arthroplasty were presented and reviewed. The risks due to aseptic loosening, infection, stiffness, dislocation/subluxation,  thromboembolic complications and other imponderables were discussed.  The patient acknowledged the explanation, agreed to proceed with the plan and consent was signed. Patient is being admitted for inpatient treatment for surgery, pain control, PT, OT, prophylactic antibiotics, VTE prophylaxis, progressive ambulation and ADL's and discharge planning.The patient is planning to be discharged home with home health services 

## 2016-09-21 ENCOUNTER — Encounter (HOSPITAL_COMMUNITY): Payer: Self-pay | Admitting: Orthopedic Surgery

## 2016-09-21 LAB — CBC
HCT: 34.8 % — ABNORMAL LOW (ref 36.0–46.0)
Hemoglobin: 11.3 g/dL — ABNORMAL LOW (ref 12.0–15.0)
MCH: 30.8 pg (ref 26.0–34.0)
MCHC: 32.5 g/dL (ref 30.0–36.0)
MCV: 94.8 fL (ref 78.0–100.0)
PLATELETS: 325 10*3/uL (ref 150–400)
RBC: 3.67 MIL/uL — ABNORMAL LOW (ref 3.87–5.11)
RDW: 12.8 % (ref 11.5–15.5)
WBC: 13 10*3/uL — AB (ref 4.0–10.5)

## 2016-09-21 LAB — BASIC METABOLIC PANEL
ANION GAP: 7 (ref 5–15)
BUN: 11 mg/dL (ref 6–20)
CALCIUM: 9.2 mg/dL (ref 8.9–10.3)
CO2: 27 mmol/L (ref 22–32)
CREATININE: 0.73 mg/dL (ref 0.44–1.00)
Chloride: 107 mmol/L (ref 101–111)
Glucose, Bld: 137 mg/dL — ABNORMAL HIGH (ref 65–99)
Potassium: 4 mmol/L (ref 3.5–5.1)
SODIUM: 141 mmol/L (ref 135–145)

## 2016-09-21 MED ORDER — SENNA 8.6 MG PO TABS: 2.0000 | ORAL_TABLET | Freq: Every day | ORAL | 3 refills | Status: AC

## 2016-09-21 MED ORDER — DOCUSATE SODIUM 100 MG PO CAPS: 100.0000 mg | ORAL_CAPSULE | Freq: Two times a day (BID) | ORAL | 3 refills | Status: AC

## 2016-09-21 MED ORDER — LIP MEDEX EX OINT
TOPICAL_OINTMENT | CUTANEOUS | Status: AC
  Filled 2016-09-21: qty 7

## 2016-09-21 MED ORDER — ASPIRIN 81 MG PO CHEW: 81.0000 mg | CHEWABLE_TABLET | Freq: Two times a day (BID) | ORAL | 1 refills | Status: AC

## 2016-09-21 MED ORDER — HYDROCODONE-ACETAMINOPHEN 5-325 MG PO TABS: 1.0000 | ORAL_TABLET | ORAL | 0 refills | Status: DC | PRN

## 2016-09-21 MED ORDER — ONDANSETRON HCL 4 MG PO TABS: 4.0000 mg | ORAL_TABLET | Freq: Four times a day (QID) | ORAL | 0 refills | Status: AC | PRN

## 2016-09-21 NOTE — Progress Notes (Signed)
RN reviewed discharge instructions with patient and family. All questions answered.   Paperwork and prescriptions given.   RN and NT rolled patient down with all belongings to patient.

## 2016-09-21 NOTE — Discharge Summary (Signed)
Physician Discharge Summary  Patient ID: Darlene Torres MRN: [id number] DOB/AGE: 08-22-1929 82 y.o.  Admit date: 09/20/2016 Discharge date: 09/21/2016  Admission Diagnoses:  Primary osteoarthritis of left hip  Discharge Diagnoses:  Principal Problem:   Primary osteoarthritis of left hip   Past Medical History:  Diagnosis Date  . Arthritis   . Diverticulitis of colon   . Hypertension     Surgeries: Procedure(s): LEFT TOTAL HIP ARTHROPLASTY ANTERIOR APPROACH on 09/20/2016   Consultants (if any):   Discharged Condition: Improved  Hospital Course: Darlene Torres is an 82 y.o. female who was admitted 09/20/2016 with a diagnosis of Primary osteoarthritis of left hip and went to the operating room on 09/20/2016 and underwent the above named procedures.    She was given perioperative antibiotics:  Anti-infectives    Start     Dose/Rate Route Frequency Ordered Stop   09/20/16 2300  ceFAZolin (ANCEF) IVPB 1 g/50 mL premix     1 g 100 mL/hr over 30 Minutes Intravenous Every 6 hours 09/20/16 2154 09/21/16 0534   09/20/16 1430  ceFAZolin (ANCEF) IVPB 2g/100 mL premix     2 g 200 mL/hr over 30 Minutes Intravenous On call to O.R. 09/20/16 1425 09/20/16 1625    .  She was given sequential compression devices, early ambulation, and ASA for DVT prophylaxis.  She benefited maximally from the hospital stay and there were no complications.    Recent vital signs:  Vitals:   09/21/16 1143 09/21/16 1444  BP: 128/74 (!) 148/53  Pulse: 71 71  Resp: 18 18  Temp: 97.8 F (36.6 C) 98.3 F (36.8 C)    Recent laboratory studies:  Lab Results  Component Value Date   HGB 11.3 (L) 09/21/2016   HGB 11.8 (L) 09/13/2016   Lab Results  Component Value Date   WBC 13.0 (H) 09/21/2016   PLT 325 09/21/2016   No results found for: INR Lab Results  Component Value Date   NA 141 09/21/2016   K 4.0 09/21/2016   CL 107 09/21/2016   CO2 27 09/21/2016   BUN 11 09/21/2016   CREATININE 0.73  09/21/2016   GLUCOSE 137 (H) 09/21/2016    Discharge Medications:     Medication List    STOP taking these medications   acetaminophen 325 MG tablet Commonly known as:  TYLENOL   traMADol 50 MG tablet Commonly known as:  ULTRAM     TAKE these medications   aspirin 81 MG chewable tablet Chew 1 tablet (81 mg total) by mouth 2 (two) times daily.   cetirizine 10 MG tablet Commonly known as:  ZYRTEC Take 10 mg by mouth daily as needed for allergies.   diphenhydrAMINE 25 mg capsule Commonly known as:  BENADRYL Take 25 mg by mouth every 6 (six) hours as needed for itching.   docusate sodium 100 MG capsule Commonly known as:  COLACE Take 1 capsule (100 mg total) by mouth 2 (two) times daily.   HYDROcodone-acetaminophen 5-325 MG tablet Commonly known as:  NORCO/VICODIN Take 1-2 tablets by mouth every 4 (four) hours as needed (breakthrough pain).   lisinopril 10 MG tablet Commonly known as:  PRINIVIL,ZESTRIL Take 10 mg by mouth daily.   meloxicam 7.5 MG tablet Commonly known as:  MOBIC Take 7.5 mg by mouth daily as needed for pain.   metoprolol succinate 25 MG 24 hr tablet Commonly known as:  TOPROL-XL Take 25 mg by mouth daily.   ondansetron 4 MG tablet Commonly known as:  ZOFRAN Take 1 tablet (4 mg total) by mouth every 6 (six) hours as needed for nausea.   oxybutynin 5 MG 24 hr tablet Commonly known as:  DITROPAN-XL Take 5 mg by mouth at bedtime.   senna 8.6 MG Tabs tablet Commonly known as:  SENOKOT Take 2 tablets (17.2 mg total) by mouth at bedtime.   timolol 0.5 % ophthalmic solution Commonly known as:  BETIMOL Place 1 drop into both eyes 2 (two) times daily.   VITAMIN D PO Take 50,000 Units by mouth once a week.       Diagnostic Studies: Dg Pelvis Portable  Result Date: 09/20/2016 CLINICAL DATA:  Postop day 0 anterior approach left total hip arthroplasty. EXAM: PORTABLE PELVIS 1-2 VIEWS COMPARISON:  Intraoperative left hip images earlier today at  5:17 p.m. Left hip images at the time of therapeutic injection 04/23/2016. FINDINGS: Anatomic alignment in the AP projection post left total hip arthroplasty. No complicating features. Contralateral right hip joint intact with well-preserved joint space for age. IMPRESSION: Anatomic alignment in the AP projection post left total hip arthroplasty without acute complicating features. Electronically Signed   By: Hulan Saashomas  Lawrence M.D.   On: 09/20/2016 18:49   Dg C-arm 1-60 Min-no Report  Result Date: 09/20/2016 CLINICAL DATA: surgery C-ARM 1-60 MINUTES Fluoroscopy was utilized by the requesting physician.  No radiographic interpretation.   Dg Hip Operative Unilat With Pelvis Left  Result Date: 09/20/2016 CLINICAL DATA:  Anterior approach left total hip arthroplasty. EXAM: OPERATIVE LEFT HIP (WITH PELVIS IF PERFORMED) 1 VIEW TECHNIQUE: Fluoroscopic spot image(s) were submitted for interpretation post-operatively. COMPARISON:  Left hip image at the time of therapeutic joint injection 04/23/2016. FINDINGS: Anatomic alignment in the AP projection post left total hip arthroplasty. IMPRESSION: Anatomic alignment in the AP projection post left total hip arthroplasty. Electronically Signed   By: Hulan Saashomas  Lawrence M.D.   On: 09/20/2016 18:51    Disposition: 01-Home or Self Care  Discharge Instructions    Call MD / Call 911    Complete by:  As directed    If you experience chest pain or shortness of breath, CALL 911 and be transported to the hospital emergency room.  If you develope a fever above 101 F, pus (white drainage) or increased drainage or redness at the wound, or calf pain, call your surgeon's office.   Constipation Prevention    Complete by:  As directed    Drink plenty of fluids.  Prune juice may be helpful.  You may use a stool softener, such as Colace (over the counter) 100 mg twice a day.  Use MiraLax (over the counter) for constipation as needed.   Diet - low sodium heart healthy    Complete by:   As directed    Driving restrictions    Complete by:  As directed    No driving for 6 weeks   Increase activity slowly as tolerated    Complete by:  As directed    Lifting restrictions    Complete by:  As directed    No lifting for 6 weeks   TED hose    Complete by:  As directed    Use stockings (TED hose) for 2 weeks on both leg(s).  You may remove them at night for sleeping.      Follow-up Information    Nabila Albarracin, Cloyde ReamsBrian James, MD. Schedule an appointment as soon as possible for a visit in 2 weeks.   Specialty:  Orthopedic Surgery Why:  For wound re-check Contact  information: [address] [place] [address] [phone]            Signed: Garnet Koyanagi 09/22/2016, 7:52 AM

## 2016-09-21 NOTE — Progress Notes (Signed)
Occupational Therapy Evaluation Patient Details Name: Darlene CandleMyrtle G Torres MRN: [id number] DOB: May 12, 1929 Today's Date: 09/21/2016    History of Present Illness Pt s/p L THR   Clinical Impression   All OT education completed and pt questions answered. No further OT needs at this time. Will sign off.    Follow Up Recommendations  No OT follow up;Supervision/Assistance - 24 hour    Equipment Recommendations  3 in 1 bedside comode    Recommendations for Other Services       Precautions / Restrictions Precautions Precautions: Fall Restrictions Weight Bearing Restrictions: No Other Position/Activity Restrictions: WBAT      Mobility Bed Mobility Overal bed mobility: Needs Assistance Bed Mobility: Sit to Supine       Sit to supine: Min assist     Transfers Overall transfer level: Needs assistance Equipment used: Rolling walker (2 wheeled) Transfers: Sit to/from Stand Sit to Stand: Min assist             Balance                                       ADL Overall ADL's : Needs assistance/impaired Eating/Feeding: Independent   Grooming: Set up           Upper Body Dressing : Set up   Lower Body Dressing: Minimal assistance;Sit to/from stand   Toilet Transfer: Min guard;Ambulation;BSC;RW   Toileting- ArchitectClothing Manipulation and Hygiene: Min guard;Sit to/from Nurse, children'sstand     Tub/Shower Transfer Details (indicate cue type and reason): pt reports she plans to sponge bathe for a while Functional mobility during ADLs: Min guard;Rolling walker General ADL Comments: Patient almost able to reach to feet, but needs a little bit of assistance with LB self-care. Her family can assist with these tasks. Has been up to bathroom with 3 in 1 over toilet and did well. Will sponge bathe "for a while" per patient.      Vision     Perception     Praxis      Pertinent Vitals/Pain Pain Assessment: 0-10 Pain Score: 4  Pain Location: L hip Pain Descriptors  / Indicators: Aching;Sore Pain Intervention(s): Limited activity within patient's tolerance;Monitored during session;Ice applied     Hand Dominance     Extremity/Trunk Assessment Upper Extremity Assessment Upper Extremity Assessment: Overall WFL for tasks assessed   Lower Extremity Assessment Lower Extremity Assessment: Defer to PT evaluation        Communication Communication Communication: No difficulties   Cognition Arousal/Alertness: Awake/alert Behavior During Therapy: WFL for tasks assessed/performed Overall Cognitive Status: Within Functional Limits for tasks assessed                     General Comments       Exercises      Shoulder Instructions      Home Living Family/patient expects to be discharged to:: Private residence Living Arrangements: Children Available Help at Discharge: Family Type of Home: House Home Access: Stairs to enter Secretary/administratorntrance Stairs-Number of Steps: 2 Entrance Stairs-Rails: None Home Layout: One level     Bathroom Shower/Tub: Chief Strategy OfficerTub/shower unit   Bathroom Toilet: Standard     Home Equipment: Cane - single point          Prior Functioning/Environment Level of Independence: Independent                 OT Problem List: Decreased strength;Decreased  range of motion;Decreased knowledge of use of DME or AE;Pain   OT Treatment/Interventions:      OT Goals(Current goals can be found in the care plan section) Acute Rehab OT Goals Patient Stated Goal: Regain IND OT Goal Formulation: All assessment and education complete, DC therapy  OT Frequency:     Barriers to D/C:            Co-evaluation              End of Session Nurse Communication: Mobility status  Activity Tolerance: Patient tolerated treatment well Patient left: in bed;with call bell/phone within reach;with nursing/sitter in room   Time: 1142-1154 OT Time Calculation (min): 12 min Charges:  OT General Charges $OT Visit: 1 Procedure OT  Evaluation $OT Eval Low Complexity: 1 Procedure G-Codes:    Ahmar Pickrell A 10-01-16, 12:41 PM

## 2016-09-21 NOTE — Evaluation (Signed)
Physical Therapy Evaluation Patient Details Name: Darlene Torres MRN: [id number] DOB: 1929-02-04 Today's Date: 09/21/2016   History of Present Illness  Pt s/p L THR  Clinical Impression  Pt s/p L THR presents with decreased L LE strength/ROM and post op pain limiting functional mobility.  Pt should progress to dc home with family assist and HHPT followup.    Follow Up Recommendations Home health PT    Equipment Recommendations  Rolling walker with 5" wheels    Recommendations for Other Services OT consult     Precautions / Restrictions Precautions Precautions: Fall Restrictions Weight Bearing Restrictions: No Other Position/Activity Restrictions: WBAT      Mobility  Bed Mobility               General bed mobility comments: Pt OOB with nursing and requests back to chair  Transfers Overall transfer level: Needs assistance Equipment used: Rolling walker (2 wheeled) Transfers: Sit to/from Stand Sit to Stand: Min assist         General transfer comment: cues for LE management and use of UEs to self assist  Ambulation/Gait Ambulation/Gait assistance: Min assist Ambulation Distance (Feet): 100 Feet Assistive device: Rolling walker (2 wheeled) Gait Pattern/deviations: Step-to pattern;Step-through pattern;Decreased step length - right;Decreased step length - left;Shuffle;Trunk flexed Gait velocity: decr   General Gait Details: Cues for posture, position from RW and initial sequence  Stairs            Wheelchair Mobility    Modified Rankin (Stroke Patients Only)       Balance Overall balance assessment: Needs assistance Sitting-balance support: No upper extremity supported;Feet supported Sitting balance-Leahy Scale: Good     Standing balance support: No upper extremity supported Standing balance-Leahy Scale: Fair                               Pertinent Vitals/Pain Pain Assessment: 0-10 Pain Score: 3  Pain Location: l HIP Pain  Descriptors / Indicators: Aching;Sore Pain Intervention(s): Limited activity within patient's tolerance;Monitored during session;Premedicated before session;Ice applied    Home Living Family/patient expects to be discharged to:: Private residence Living Arrangements: Children Available Help at Discharge: Family Type of Home: House Home Access: Stairs to enter Entrance Stairs-Rails: None Secretary/administrator of Steps: 2 Home Layout: One level Home Equipment: Cane - single point      Prior Function Level of Independence: Independent               Hand Dominance        Extremity/Trunk Assessment   Upper Extremity Assessment: Defer to OT evaluation           Lower Extremity Assessment: LLE deficits/detail   LLE Deficits / Details: Strength at hip 2+/5 with AAROM at hip to 75 flex and 10 abd     Communication   Communication: No difficulties  Cognition Arousal/Alertness: Awake/alert Behavior During Therapy: WFL for tasks assessed/performed Overall Cognitive Status: Within Functional Limits for tasks assessed                      General Comments      Exercises Total Joint Exercises Ankle Circles/Pumps: AROM;Both;15 reps;Supine Quad Sets: AROM;Both;10 reps;Supine Heel Slides: AAROM;Left;Supine;15 reps Hip ABduction/ADduction: AAROM;Left;15 reps;Supine   Assessment/Plan    PT Assessment Patient needs continued PT services  PT Problem List Decreased range of motion;Decreased strength;Decreased activity tolerance;Decreased mobility;Pain;Decreased knowledge of use of DME  PT Treatment Interventions DME instruction;Gait training;Stair training;Functional mobility training;Therapeutic activities;Therapeutic exercise;Patient/family education    PT Goals (Current goals can be found in the Care Plan section)  Acute Rehab PT Goals Patient Stated Goal: Regain IND PT Goal Formulation: With patient Time For Goal Achievement: 09/24/16 Potential  to Achieve Goals: Good    Frequency 7X/week   Barriers to discharge        Co-evaluation               End of Session Equipment Utilized During Treatment: Gait belt Activity Tolerance: Patient tolerated treatment well Patient left: in chair;with call bell/phone within reach;with family/visitor present Nurse Communication: Mobility status         Time: [id number] PT Time Calculation (min) (ACUTE ONLY): 27 min   Charges:   PT Evaluation $PT Eval Low Complexity: 1 Procedure PT Treatments $Therapeutic Exercise: 8-22 mins   PT G Codes:        Darlene Torres 09/21/2016, 12:28 PM

## 2016-09-21 NOTE — Progress Notes (Signed)
   Subjective:  Patient reports pain as mild to moderate.  No c/o.   Objective:   VITALS:   Vitals:   09/20/16 2159 09/20/16 2249 09/20/16 2359 09/21/16 0515  BP: (!) 155/91 (!) 177/62 (!) 201/89 (!) 156/65  Pulse: (!) 47 71 87 97  Resp: 14   18  Temp: 98.4 F (36.9 C) 98.1 F (36.7 C) 98 F (36.7 C) 97.9 F (36.6 C)  TempSrc: Oral Oral Oral Oral  SpO2: 93% 100% 100% 100%  Weight:      Height:        ABD soft Sensation intact distally Intact pulses distally Dorsiflexion/Plantar flexion intact Incision: dressing C/D/I Compartment soft   Lab Results  Component Value Date   WBC 13.0 (H) 09/21/2016   HGB 11.3 (L) 09/21/2016   HCT 34.8 (L) 09/21/2016   MCV 94.8 09/21/2016   PLT 325 09/21/2016   BMET    Component Value Date/Time   NA 141 09/21/2016 0502   K 4.0 09/21/2016 0502   CL 107 09/21/2016 0502   CO2 27 09/21/2016 0502   GLUCOSE 137 (H) 09/21/2016 0502   BUN 11 09/21/2016 0502   CREATININE 0.73 09/21/2016 0502   CALCIUM 9.2 09/21/2016 0502   GFRNONAA >60 09/21/2016 0502   GFRAA >60 09/21/2016 0502     Assessment/Plan: 1 Day Post-Op   Principal Problem:   Primary osteoarthritis of left hip  WBAT with walker ASA, SCDs, TEDs PT/OT PO pain control  D/ C home with HHPT    Raad Clayson, Cloyde ReamsBrian James 09/21/2016, 7:10 AM   Samson FredericBrian Deaire Mcwhirter, MD Cell [phone]

## 2016-09-21 NOTE — Progress Notes (Signed)
Physical Therapy Treatment Patient Details Name: Darlene CandleMyrtle G Coachman MRN: [id number] DOB: January 21, 1929 Today's Date: 09/21/2016    History of Present Illness Pt s/p L THR    PT Comments    Pt progressing well with mobility.  Reviewed stairs and car transfers and RW for home adjusted to proper height.  Follow Up Recommendations  Home health PT     Equipment Recommendations  Rolling walker with 5" wheels    Recommendations for Other Services OT consult     Precautions / Restrictions Precautions Precautions: Fall Restrictions Weight Bearing Restrictions: No Other Position/Activity Restrictions: WBAT    Mobility  Bed Mobility Overal bed mobility: Needs Assistance Bed Mobility: Sit to Supine;Supine to Sit     Supine to sit: Min guard Sit to supine: Min guard   General bed mobility comments: Cues for sequence.  Pt self assisting L LE with UEs  Transfers Overall transfer level: Needs assistance Equipment used: Rolling walker (2 wheeled) Transfers: Sit to/from Stand Sit to Stand: Min guard;Supervision         General transfer comment: cues for LE management and use of UEs to self assist  Ambulation/Gait Ambulation/Gait assistance: Min guard;Supervision Ambulation Distance (Feet): 150 Feet Assistive device: Rolling walker (2 wheeled) Gait Pattern/deviations: Step-to pattern;Step-through pattern;Decreased step length - right;Decreased step length - left;Shuffle;Trunk flexed Gait velocity: decr Gait velocity interpretation: Below normal speed for age/gender General Gait Details: min cues for posture, position from RW and initial sequence   Stairs Stairs: Yes Stairs assistance: Min guard Stair Management: One rail Right;Step to pattern;Forwards;With cane Number of Stairs: 2 General stair comments: min cues for sequence and foot/cane placement.  Pt states "this is how I have had to do it before surgery"  Wheelchair Mobility    Modified Rankin (Stroke Patients Only)        Balance                                    Cognition Arousal/Alertness: Awake/alert Behavior During Therapy: WFL for tasks assessed/performed Overall Cognitive Status: Within Functional Limits for tasks assessed                      Exercises      General Comments        Pertinent Vitals/Pain Pain Assessment: 0-10 Pain Score: 4  Pain Location: L hip  Pain Descriptors / Indicators: Aching;Sore Pain Intervention(s): Limited activity within patient's tolerance;Monitored during session;Premedicated before session;Ice applied    Home Living                      Prior Function            PT Goals (current goals can now be found in the care plan section) Acute Rehab PT Goals Patient Stated Goal: Regain IND PT Goal Formulation: With patient Time For Goal Achievement: 09/24/16 Potential to Achieve Goals: Good Progress towards PT goals: Progressing toward goals    Frequency    7X/week      PT Plan Current plan remains appropriate    Co-evaluation             End of Session           Time: 1315-1350 PT Time Calculation (min) (ACUTE ONLY): 35 min  Charges:  $Gait Training: 8-22 mins $Therapeutic Activity: 8-22 mins  G Codes:      Jhoan Schmieder 21-Oct-2016, 4:35 PM

## 2016-09-24 NOTE — Anesthesia Postprocedure Evaluation (Signed)
Anesthesia Post Note  Patient: Darlene Torres  Procedure(s) Performed: Procedure(s) (LRB): LEFT TOTAL HIP ARTHROPLASTY ANTERIOR APPROACH (Left)  Patient location during evaluation: PACU Anesthesia Type: Spinal Level of consciousness: sedated and patient cooperative Pain management: pain level controlled Vital Signs Assessment: post-procedure vital signs reviewed and stable Respiratory status: spontaneous breathing Cardiovascular status: stable Postop Assessment: spinal receding Anesthetic complications: no    Last Vitals:  Vitals:   09/21/16 1143 09/21/16 1444  BP: 128/74 (!) 148/53  Pulse: 71 71  Resp: 18 18  Temp: 36.6 C 36.8 C    Last Pain:  Vitals:   09/21/16 1744  TempSrc:   PainSc: 1                  Lewie LoronJohn Chaunda Vandergriff

## 2016-11-03 ENCOUNTER — Ambulatory Visit: Payer: Self-pay | Admitting: Orthopedic Surgery

## 2016-11-08 ENCOUNTER — Ambulatory Visit: Payer: Self-pay | Admitting: Orthopedic Surgery

## 2016-11-08 NOTE — H&P (Signed)
TOTAL KNEE ADMISSION H&P  Patient is being admitted for right total knee arthroplasty.  Subjective:  Chief Complaint:right knee pain.  HPI: Darlene Torres, 82 y.o. female, has a history of pain and functional disability in the right knee due to arthritis and has failed non-surgical conservative treatments for greater than 12 weeks to includeNSAID's and/or analgesics, corticosteriod injections, flexibility and strengthening excercises, use of assistive devices, weight reduction as appropriate and activity modification.  Onset of symptoms was gradual, starting 2 years ago with gradually worsening course since that time. The patient noted no past surgery on the right knee(s).  Patient currently rates pain in the right knee(s) at 10 out of 10 with activity. Patient has night pain, worsening of pain with activity and weight bearing, pain that interferes with activities of daily living, pain with passive range of motion, crepitus and joint swelling.  Patient has evidence of subchondral cysts, subchondral sclerosis, periarticular osteophytes and joint space narrowing by imaging studies.  There is no active infection.  Patient Active Problem List   Diagnosis Date Noted  . Primary osteoarthritis of left hip 09/20/2016   Past Medical History:  Diagnosis Date  . Arthritis   . Diverticulitis of colon   . Hypertension     Past Surgical History:  Procedure Laterality Date  . ABDOMINAL HYSTERECTOMY    . DILATION AND CURETTAGE OF UTERUS    . SAVORY DILATION     esophagus stretching with endoscopy  . TOTAL HIP ARTHROPLASTY Left 09/20/2016   Procedure: LEFT TOTAL HIP ARTHROPLASTY ANTERIOR APPROACH;  Surgeon: Samson FredericBrian Lynette Noah, MD;  Location: [facility];  Service: Orthopedics;  Laterality: Left;  RNFA     (Not in a hospital admission) No Known Allergies  Social History  Substance Use Topics  . Smoking status: Never Smoker  . Smokeless tobacco: Never Used  . Alcohol use No    No family history on file.    Review of Systems  Constitutional: Positive for diaphoresis.  HENT: Negative.   Eyes: Negative.   Respiratory: Negative.   Cardiovascular: Negative.   Gastrointestinal: Negative.   Genitourinary: Positive for frequency.  Musculoskeletal: Positive for back pain and joint pain.  Skin: Negative.   Neurological: Negative.   Endo/Heme/Allergies: Negative.   Psychiatric/Behavioral: Negative.     Objective:  Physical Exam  Vitals reviewed. Constitutional: She is oriented to person, place, and time. She appears well-developed and well-nourished.  HENT:  Head: Normocephalic and atraumatic.  Eyes: Conjunctivae and EOM are normal. Pupils are equal, round, and reactive to light.  Neck: Normal range of motion. Neck supple.  Cardiovascular: Normal rate, regular rhythm and intact distal pulses.   Respiratory: Effort normal. No respiratory distress.  GI: Soft. She exhibits no distension.  Genitourinary:  Genitourinary Comments: deferred  Musculoskeletal:       Right knee: She exhibits decreased range of motion and abnormal alignment. Tenderness found. Medial joint line and lateral joint line tenderness noted.  Neurological: She is alert and oriented to person, place, and time. She has normal reflexes.  Skin: Skin is warm and dry.  Psychiatric: She has a normal mood and affect. Her behavior is normal. Judgment and thought content normal.    Vital signs in last 24 hours: @VSRANGES @  Labs:   Estimated body mass index is 23.59 kg/m as calculated from the following:   Height as of 09/20/16: 5\' 2"  (1.575 m).   Weight as of 09/20/16: 58.5 kg (129 lb).   Imaging Review Plain radiographs demonstrate severe degenerative joint  disease of the right knee(s). The overall alignment issignificant valgus. The bone quality appears to be adequate for age and reported activity level.  Assessment/Plan:  End stage arthritis, right knee   The patient history, physical examination, clinical judgment  of the provider and imaging studies are consistent with end stage degenerative joint disease of the right knee(s) and total knee arthroplasty is deemed medically necessary. The treatment options including medical management, injection therapy arthroscopy and arthroplasty were discussed at length. The risks and benefits of total knee arthroplasty were presented and reviewed. The risks due to aseptic loosening, infection, stiffness, patella tracking problems, thromboembolic complications and other imponderables were discussed. The patient acknowledged the explanation, agreed to proceed with the plan and consent was signed. Patient is being admitted for inpatient treatment for surgery, pain control, PT, OT, prophylactic antibiotics, VTE prophylaxis, progressive ambulation and ADL's and discharge planning. The patient is planning to be discharged home with home health services

## 2016-11-20 ENCOUNTER — Encounter (HOSPITAL_COMMUNITY): Payer: Self-pay

## 2016-11-20 ENCOUNTER — Encounter (HOSPITAL_COMMUNITY)
Admission: RE | Admit: 2016-11-20 | Discharge: 2016-11-20 | Disposition: A | Discharge status: Home / Self Care | Payer: Medicare Other | Source: Ambulatory Visit | Attending: Orthopedic Surgery | Admitting: Orthopedic Surgery

## 2016-11-20 DIAGNOSIS — H409 Unspecified glaucoma: Secondary | ICD-10-CM | POA: Diagnosis not present

## 2016-11-20 DIAGNOSIS — R32 Unspecified urinary incontinence: Secondary | ICD-10-CM | POA: Insufficient documentation

## 2016-11-20 DIAGNOSIS — M1990 Unspecified osteoarthritis, unspecified site: Secondary | ICD-10-CM | POA: Diagnosis not present

## 2016-11-20 DIAGNOSIS — K5730 Diverticulosis of large intestine without perforation or abscess without bleeding: Secondary | ICD-10-CM | POA: Insufficient documentation

## 2016-11-20 DIAGNOSIS — Z01812 Encounter for preprocedural laboratory examination: Secondary | ICD-10-CM | POA: Insufficient documentation

## 2016-11-20 DIAGNOSIS — Z96642 Presence of left artificial hip joint: Secondary | ICD-10-CM | POA: Insufficient documentation

## 2016-11-20 DIAGNOSIS — I10 Essential (primary) hypertension: Secondary | ICD-10-CM | POA: Diagnosis not present

## 2016-11-20 HISTORY — DX: Unspecified glaucoma: H40.9

## 2016-11-20 HISTORY — DX: Personal history of other diseases of the respiratory system: Z87.09

## 2016-11-20 HISTORY — DX: Unspecified urinary incontinence: R32

## 2016-11-20 HISTORY — DX: Urgency of urination: R39.15

## 2016-11-20 HISTORY — DX: Other seasonal allergic rhinitis: J30.2

## 2016-11-20 LAB — TYPE AND SCREEN
ABO/RH(D): B POS
Antibody Screen: NEGATIVE

## 2016-11-20 LAB — BASIC METABOLIC PANEL
ANION GAP: 10 (ref 5–15)
BUN: 10 mg/dL (ref 6–20)
CALCIUM: 9.6 mg/dL (ref 8.9–10.3)
CO2: 24 mmol/L (ref 22–32)
Chloride: 106 mmol/L (ref 101–111)
Creatinine, Ser: 0.94 mg/dL (ref 0.44–1.00)
GFR calc Af Amer: >60 mL/min (ref >60)
GFR, EST NON AFRICAN AMERICAN: 54 mL/min — AB (ref >60)
GLUCOSE: 88 mg/dL (ref 65–99)
POTASSIUM: 4.2 mmol/L (ref 3.5–5.1)
SODIUM: 140 mmol/L (ref 135–145)

## 2016-11-20 LAB — ABO/RH: ABO/RH(D): B POS

## 2016-11-20 LAB — CBC
HEMATOCRIT: 38.3 % (ref 36.0–46.0)
HEMOGLOBIN: 12.3 g/dL (ref 12.0–15.0)
MCH: 29.9 pg (ref 26.0–34.0)
MCHC: 32.1 g/dL (ref 30.0–36.0)
MCV: 93.2 fL (ref 78.0–100.0)
Platelets: 328 10*3/uL (ref 150–400)
RBC: 4.11 MIL/uL (ref 3.87–5.11)
RDW: 13.2 % (ref 11.5–15.5)
WBC: 6.9 10*3/uL (ref 4.0–10.5)

## 2016-11-20 LAB — SURGICAL PCR SCREEN
MRSA, PCR: NEGATIVE
STAPHYLOCOCCUS AUREUS: NEGATIVE

## 2016-11-20 NOTE — Pre-Procedure Instructions (Signed)
Darlene Torres  11/20/2016      [facility] [place] - [address] [place] [address] Phone: [phone] Fax: [phone]    Your procedure is scheduled on Monday, November 26, 2016.  [facility] at 12:45 P.M.   Call this number if you have problems the morning of surgery:  [phone]   Remember:  Do not eat food or drink liquids after midnight.   Take these medicines the morning of surgery with A SIP OF WATER: Cetirizine (Zyrtec), Fluticasone (Flonase) if needed, Metoprolol Succinate (Toprol-XL), Tramadol (Ultram) if needed, eye drops as needed.  Stop taking: Meloxicam (Mobic), Aspirin, NSAIDS, Aleve, Naproxen, Ibuprofen, Advil, Motrin, BC's, Goody's, Fish oil, all herbal medications, and all vitamins.      Do not wear jewelry, make-up or nail polish.  Do not wear lotions, powders, or perfumes, or deoderant.  Do not shave 48 hours prior to surgery.    Do not bring valuables to the hospital.  [facility] not [facility] for any belongings or valuables.  Contacts, dentures or bridgework may not be worn into surgery.  Leave your suitcase in the car.  After surgery it may be brought to your room.  For patients admitted to the hospital, discharge time will be determined by your treatment team.  Patients discharged the day of surgery will not be allowed to drive home.   Special instructions:  Preparing for Surgery.   [place]- Preparing For Surgery  Before surgery, you can play an important role. Because skin is not sterile, your skin needs to be as free of germs as possible. You can reduce the number of germs on your skin by washing with CHG (chlorahexidine gluconate) Soap before surgery.  CHG is an antiseptic cleaner which kills germs and bonds with the skin to continue killing germs even after washing.  Please do not use if you have an allergy to CHG or antibacterial soaps. If your skin becomes  reddened/irritated stop using the CHG.  Do not shave (including legs and underarms) for at least 48 hours prior to first CHG shower. It is OK to shave your face.  Please follow these instructions carefully.   1. Shower the NIGHT BEFORE SURGERY and the MORNING OF SURGERY with CHG.   2. If you chose to wash your hair, wash your hair first as usual with your normal shampoo.  3. After you shampoo, rinse your hair and body thoroughly to remove the shampoo.  4. Use CHG as you would any other liquid soap. You can apply CHG directly to the skin and wash gently with a scrungie or a clean washcloth.   5. Apply the CHG Soap to your body ONLY FROM THE NECK DOWN.  Do not use on open wounds or open sores. Avoid contact with your eyes, ears, mouth and genitals (private parts). Wash genitals (private parts) with your normal soap.  6. Wash thoroughly, paying special attention to the area where your surgery will be performed.  7. Thoroughly rinse your body with warm water from the neck down.  8. DO NOT shower/wash with your normal soap after using and rinsing off the CHG Soap.  9. Pat yourself dry with a CLEAN TOWEL.   10. Wear CLEAN PAJAMAS   11. Place CLEAN SHEETS on your bed the night of your first shower and DO NOT SLEEP WITH PETS.  Day of Surgery: Do not apply any deodorants/lotions. Please wear  clean clothes to the hospital/surgery center.     Please read over the following fact sheets that you were given. MRSA Information

## 2016-11-20 NOTE — Progress Notes (Signed)
PCP - Dr. Marylynn PearsonBrian Burkhart Cardiologist - denies  EKG - 09/13/16 CXR - denies Echo/stress test/cardiac cath - denies  Patient denies chest pain and shortness of breath at PAT appointment.

## 2016-11-22 NOTE — Progress Notes (Signed)
Anesthesia Chart Review:  Pt is an 82 year old female scheduled for R total knee arthroplasty with navigation complex on 11/26/2016 with Darlene FredericBrian Swinteck, MD.   - PCP is Darlene PearsonBrian Burkhart, MD  PMH includes:  HTN, glaucoma. Never smoker. BMI 24. S/p L THA 09/20/16.   Medications include: lisinopril, metoprolol, timolol  Preoperative labs reviewed.    EKG 09/13/16: Sinus bradycardia (49 bpm) with 1st degree A-V block.Moderate voltage criteria for LVH, may be normal variant.Inferior infarct, age undetermined. Anteroseptal infarct, age undetermined.  Pt tolerated a THA 09/20/16 without issue.   If no changes, I anticipate pt can proceed with surgery as scheduled.   Darlene Mastngela Felicha Frayne, FNP-BC [facility]/Anesthesiology Phone: [phone] 11/22/2016 1:16 PM

## 2016-11-23 MED ORDER — TRANEXAMIC ACID 1000 MG/10ML IV SOLN
1000.0000 mg | INTRAVENOUS | Status: AC
  Administered 2016-11-26: 1000 mg via INTRAVENOUS
  Filled 2016-11-23: qty 10

## 2016-11-26 ENCOUNTER — Inpatient Hospital Stay (HOSPITAL_COMMUNITY)
Admission: RE | Admit: 2016-11-26 | Discharge: 2016-11-28 | DRG: 470 | Disposition: A | Discharge status: Home Health | Payer: Medicare Other | Source: Ambulatory Visit | Attending: Orthopedic Surgery | Admitting: Orthopedic Surgery

## 2016-11-26 ENCOUNTER — Encounter (HOSPITAL_COMMUNITY)
Admission: RE | Disposition: A | Discharge status: Home Health | Payer: Self-pay | Source: Ambulatory Visit | Attending: Orthopedic Surgery

## 2016-11-26 ENCOUNTER — Inpatient Hospital Stay (HOSPITAL_COMMUNITY): Payer: Medicare Other

## 2016-11-26 ENCOUNTER — Inpatient Hospital Stay (HOSPITAL_COMMUNITY): Payer: Medicare Other | Admitting: Certified Registered Nurse Anesthetist

## 2016-11-26 ENCOUNTER — Inpatient Hospital Stay (HOSPITAL_COMMUNITY): Payer: Medicare Other | Admitting: Emergency Medicine

## 2016-11-26 DIAGNOSIS — I10 Essential (primary) hypertension: Secondary | ICD-10-CM | POA: Diagnosis present

## 2016-11-26 DIAGNOSIS — Z96642 Presence of left artificial hip joint: Secondary | ICD-10-CM | POA: Diagnosis present

## 2016-11-26 DIAGNOSIS — M1711 Unilateral primary osteoarthritis, right knee: Secondary | ICD-10-CM | POA: Diagnosis present

## 2016-11-26 DIAGNOSIS — Z8709 Personal history of other diseases of the respiratory system: Secondary | ICD-10-CM

## 2016-11-26 DIAGNOSIS — Z09 Encounter for follow-up examination after completed treatment for conditions other than malignant neoplasm: Secondary | ICD-10-CM

## 2016-11-26 DIAGNOSIS — H409 Unspecified glaucoma: Secondary | ICD-10-CM | POA: Diagnosis present

## 2016-11-26 DIAGNOSIS — M25561 Pain in right knee: Secondary | ICD-10-CM | POA: Diagnosis present

## 2016-11-26 HISTORY — PX: TOTAL KNEE ARTHROPLASTY: SHX125

## 2016-11-26 SURGERY — ARTHROPLASTY, KNEE, TOTAL
Anesthesia: Monitor Anesthesia Care | Laterality: Right

## 2016-11-26 MED ORDER — LORATADINE 10 MG PO TABS
10.0000 mg | ORAL_TABLET | Freq: Every day | ORAL | Status: DC
  Administered 2016-11-27 – 2016-11-28 (×2): 10 mg via ORAL
  Filled 2016-11-26 (×2): qty 1

## 2016-11-26 MED ORDER — FLUTICASONE PROPIONATE 50 MCG/ACT NA SUSP
1.0000 | Freq: Every day | NASAL | Status: DC | PRN
  Filled 2016-11-26: qty 16

## 2016-11-26 MED ORDER — TIMOLOL MALEATE 0.5 % OP SOLN
1.0000 [drp] | Freq: Every day | OPHTHALMIC | Status: DC
  Administered 2016-11-27 – 2016-11-28 (×2): 1 [drp] via OPHTHALMIC
  Filled 2016-11-26: qty 5

## 2016-11-26 MED ORDER — FENTANYL CITRATE (PF) 100 MCG/2ML IJ SOLN
INTRAMUSCULAR | Status: DC | PRN
  Administered 2016-11-26: 50 ug via INTRAVENOUS

## 2016-11-26 MED ORDER — SODIUM CHLORIDE 0.9 % IV SOLN
INTRAVENOUS | Status: DC
  Administered 2016-11-26 – 2016-11-27 (×2): via INTRAVENOUS

## 2016-11-26 MED ORDER — ONDANSETRON HCL 4 MG PO TABS: 4.0000 mg | ORAL_TABLET | Freq: Four times a day (QID) | ORAL | Status: DC | PRN

## 2016-11-26 MED ORDER — CEFAZOLIN SODIUM-DEXTROSE 2-4 GM/100ML-% IV SOLN
2.0000 g | INTRAVENOUS | Status: AC
  Administered 2016-11-26: 2 g via INTRAVENOUS
  Filled 2016-11-26: qty 100

## 2016-11-26 MED ORDER — ACETAMINOPHEN 650 MG RE SUPP: 650.0000 mg | Freq: Four times a day (QID) | RECTAL | Status: DC | PRN

## 2016-11-26 MED ORDER — MIDAZOLAM HCL 2 MG/2ML IJ SOLN
INTRAMUSCULAR | Status: AC
  Filled 2016-11-26: qty 2

## 2016-11-26 MED ORDER — LISINOPRIL 10 MG PO TABS
10.0000 mg | ORAL_TABLET | Freq: Every day | ORAL | Status: DC
  Administered 2016-11-27 – 2016-11-28 (×2): 10 mg via ORAL
  Filled 2016-11-26 (×2): qty 1

## 2016-11-26 MED ORDER — SODIUM CHLORIDE 0.9 % IV SOLN: INTRAVENOUS | Status: DC

## 2016-11-26 MED ORDER — VITAMIN D (ERGOCALCIFEROL) 1.25 MG (50000 UNIT) PO CAPS: 50000.0000 [IU] | ORAL_CAPSULE | ORAL | Status: DC

## 2016-11-26 MED ORDER — KETOROLAC TROMETHAMINE 30 MG/ML IJ SOLN
INTRAMUSCULAR | Status: DC | PRN
  Administered 2016-11-26: 30 mg via INTRAVENOUS

## 2016-11-26 MED ORDER — KETOROLAC TROMETHAMINE 15 MG/ML IJ SOLN
7.5000 mg | Freq: Four times a day (QID) | INTRAMUSCULAR | Status: AC
Start: 2016-11-27 — End: 2016-11-27
  Administered 2016-11-27 (×4): 7.5 mg via INTRAVENOUS
  Filled 2016-11-26 (×4): qty 1

## 2016-11-26 MED ORDER — HYDROMORPHONE HCL 2 MG/ML IJ SOLN: 0.5000 mg | INTRAMUSCULAR | Status: DC | PRN

## 2016-11-26 MED ORDER — METOCLOPRAMIDE HCL 5 MG/ML IJ SOLN: 5.0000 mg | Freq: Three times a day (TID) | INTRAMUSCULAR | Status: DC | PRN

## 2016-11-26 MED ORDER — ALUM & MAG HYDROXIDE-SIMETH 200-200-20 MG/5ML PO SUSP: 30.0000 mL | ORAL | Status: DC | PRN

## 2016-11-26 MED ORDER — PROPOFOL 500 MG/50ML IV EMUL
INTRAVENOUS | Status: DC | PRN
  Administered 2016-11-26: 50 ug/kg/min via INTRAVENOUS

## 2016-11-26 MED ORDER — PHENOL 1.4 % MT LIQD: 1.0000 | OROMUCOSAL | Status: DC | PRN

## 2016-11-26 MED ORDER — SODIUM CHLORIDE 0.9 % IR SOLN
Status: DC | PRN
  Administered 2016-11-26: 3000 mL

## 2016-11-26 MED ORDER — PHENYLEPHRINE HCL 10 MG/ML IJ SOLN
INTRAVENOUS | Status: DC | PRN
  Administered 2016-11-26: 25 ug/min via INTRAVENOUS

## 2016-11-26 MED ORDER — OXYBUTYNIN CHLORIDE ER 5 MG PO TB24
5.0000 mg | ORAL_TABLET | Freq: Every evening | ORAL | Status: DC | PRN
  Filled 2016-11-26: qty 1

## 2016-11-26 MED ORDER — METHOCARBAMOL 1000 MG/10ML IJ SOLN
500.0000 mg | Freq: Four times a day (QID) | INTRAVENOUS | Status: DC | PRN
  Filled 2016-11-26: qty 5

## 2016-11-26 MED ORDER — CHLORHEXIDINE GLUCONATE 4 % EX LIQD: 60.0000 mL | Freq: Once | CUTANEOUS | Status: DC

## 2016-11-26 MED ORDER — METOPROLOL SUCCINATE ER 25 MG PO TB24
25.0000 mg | ORAL_TABLET | Freq: Every day | ORAL | Status: DC
  Administered 2016-11-27 – 2016-11-28 (×2): 25 mg via ORAL
  Filled 2016-11-26 (×2): qty 1

## 2016-11-26 MED ORDER — BUPIVACAINE HCL (PF) 0.5 % IJ SOLN
INTRAMUSCULAR | Status: AC
  Filled 2016-11-26: qty 30

## 2016-11-26 MED ORDER — ASPIRIN 81 MG PO CHEW
81.0000 mg | CHEWABLE_TABLET | Freq: Two times a day (BID) | ORAL | Status: DC
  Administered 2016-11-26 – 2016-11-28 (×4): 81 mg via ORAL
  Filled 2016-11-26 (×4): qty 1

## 2016-11-26 MED ORDER — BUPIVACAINE HCL (PF) 0.5 % IJ SOLN
INTRAMUSCULAR | Status: DC | PRN
  Administered 2016-11-26: 30 mL

## 2016-11-26 MED ORDER — LACTATED RINGERS IV SOLN
INTRAVENOUS | Status: DC | PRN
  Administered 2016-11-26: 15:00:00 via INTRAVENOUS

## 2016-11-26 MED ORDER — MIDAZOLAM HCL 2 MG/2ML IJ SOLN
INTRAMUSCULAR | Status: DC | PRN
  Administered 2016-11-26: 1 mg via INTRAVENOUS

## 2016-11-26 MED ORDER — MENTHOL 3 MG MT LOZG: 1.0000 | LOZENGE | OROMUCOSAL | Status: DC | PRN

## 2016-11-26 MED ORDER — METHOCARBAMOL 500 MG PO TABS
500.0000 mg | ORAL_TABLET | Freq: Four times a day (QID) | ORAL | Status: DC | PRN
  Administered 2016-11-26 – 2016-11-28 (×2): 500 mg via ORAL
  Filled 2016-11-26: qty 1

## 2016-11-26 MED ORDER — SODIUM CHLORIDE 0.9 % IR SOLN
Status: DC | PRN
  Administered 2016-11-26: 250 mL

## 2016-11-26 MED ORDER — EPHEDRINE SULFATE 50 MG/ML IJ SOLN
INTRAMUSCULAR | Status: DC | PRN
  Administered 2016-11-26 (×2): 5 mg via INTRAVENOUS

## 2016-11-26 MED ORDER — GLYCOPYRROLATE 0.2 MG/ML IJ SOLN
INTRAMUSCULAR | Status: DC | PRN
  Administered 2016-11-26: 0.2 mg via INTRAVENOUS

## 2016-11-26 MED ORDER — FENTANYL CITRATE (PF) 100 MCG/2ML IJ SOLN
INTRAMUSCULAR | Status: AC
  Filled 2016-11-26: qty 2

## 2016-11-26 MED ORDER — ACETAMINOPHEN 10 MG/ML IV SOLN
1000.0000 mg | INTRAVENOUS | Status: AC
  Administered 2016-11-26: 1000 mg via INTRAVENOUS
  Filled 2016-11-26: qty 100

## 2016-11-26 MED ORDER — 0.9 % SODIUM CHLORIDE (POUR BTL) OPTIME
TOPICAL | Status: DC | PRN
  Administered 2016-11-26: 1000 mL

## 2016-11-26 MED ORDER — KETOROLAC TROMETHAMINE 30 MG/ML IJ SOLN
INTRAMUSCULAR | Status: AC
  Filled 2016-11-26: qty 2

## 2016-11-26 MED ORDER — ACETAMINOPHEN 325 MG PO TABS: 650.0000 mg | ORAL_TABLET | Freq: Four times a day (QID) | ORAL | Status: DC | PRN

## 2016-11-26 MED ORDER — POLYETHYLENE GLYCOL 3350 17 G PO PACK: 17.0000 g | PACK | Freq: Every day | ORAL | Status: DC | PRN

## 2016-11-26 MED ORDER — LACTATED RINGERS IV SOLN
Freq: Once | INTRAVENOUS | Status: AC
  Administered 2016-11-26: 14:00:00 via INTRAVENOUS

## 2016-11-26 MED ORDER — METHOCARBAMOL 500 MG PO TABS
ORAL_TABLET | ORAL | Status: AC
  Filled 2016-11-26: qty 1

## 2016-11-26 MED ORDER — TIMOLOL HEMIHYDRATE 0.5 % OP SOLN: 1.0000 [drp] | Freq: Every day | OPHTHALMIC | Status: DC

## 2016-11-26 MED ORDER — HYDROCODONE-ACETAMINOPHEN 5-325 MG PO TABS
ORAL_TABLET | ORAL | Status: AC
  Filled 2016-11-26: qty 2

## 2016-11-26 MED ORDER — SODIUM CHLORIDE 0.9 % IJ SOLN
INTRAMUSCULAR | Status: DC | PRN
  Administered 2016-11-26: 30 mL via INTRAVENOUS

## 2016-11-26 MED ORDER — CEFAZOLIN IN D5W 1 GM/50ML IV SOLN
1.0000 g | Freq: Four times a day (QID) | INTRAVENOUS | Status: AC
  Administered 2016-11-26 – 2016-11-27 (×2): 1 g via INTRAVENOUS
  Filled 2016-11-26 (×2): qty 50

## 2016-11-26 MED ORDER — DOCUSATE SODIUM 100 MG PO CAPS
100.0000 mg | ORAL_CAPSULE | Freq: Two times a day (BID) | ORAL | Status: DC
  Administered 2016-11-26 – 2016-11-28 (×4): 100 mg via ORAL
  Filled 2016-11-26 (×4): qty 1

## 2016-11-26 MED ORDER — HYDROCODONE-ACETAMINOPHEN 5-325 MG PO TABS
1.0000 | ORAL_TABLET | ORAL | Status: DC | PRN
  Administered 2016-11-26: 2 via ORAL
  Administered 2016-11-27: 1 via ORAL
  Administered 2016-11-27: 2 via ORAL
  Administered 2016-11-28: 1 via ORAL
  Filled 2016-11-26: qty 1
  Filled 2016-11-26: qty 2
  Filled 2016-11-26: qty 1

## 2016-11-26 MED ORDER — ONDANSETRON HCL 4 MG/2ML IJ SOLN: 4.0000 mg | Freq: Four times a day (QID) | INTRAMUSCULAR | Status: DC | PRN

## 2016-11-26 MED ORDER — SENNA 8.6 MG PO TABS
2.0000 | ORAL_TABLET | Freq: Every day | ORAL | Status: DC
  Administered 2016-11-26 – 2016-11-27 (×2): 17.2 mg via ORAL
  Filled 2016-11-26 (×2): qty 2

## 2016-11-26 MED ORDER — LATANOPROST 0.005 % OP SOLN
1.0000 [drp] | Freq: Every day | OPHTHALMIC | Status: DC
  Administered 2016-11-26 – 2016-11-27 (×2): 1 [drp] via OPHTHALMIC
  Filled 2016-11-26: qty 2.5

## 2016-11-26 MED ORDER — POVIDONE-IODINE 10 % EX SWAB: 2.0000 "application " | Freq: Once | CUTANEOUS | Status: DC

## 2016-11-26 MED ORDER — METOCLOPRAMIDE HCL 5 MG PO TABS: 5.0000 mg | ORAL_TABLET | Freq: Three times a day (TID) | ORAL | Status: DC | PRN

## 2016-11-26 MED ORDER — DEXAMETHASONE SODIUM PHOSPHATE 10 MG/ML IJ SOLN
10.0000 mg | Freq: Once | INTRAMUSCULAR | Status: AC
  Administered 2016-11-27: 10 mg via INTRAVENOUS
  Filled 2016-11-26: qty 1

## 2016-11-26 MED ORDER — DIPHENHYDRAMINE HCL 12.5 MG/5ML PO ELIX: 12.5000 mg | ORAL_SOLUTION | ORAL | Status: DC | PRN

## 2016-11-26 MED ORDER — TRANEXAMIC ACID 1000 MG/10ML IV SOLN
1000.0000 mg | Freq: Once | INTRAVENOUS | Status: AC
  Administered 2016-11-27: 1000 mg via INTRAVENOUS
  Filled 2016-11-26: qty 10

## 2016-11-26 SURGICAL SUPPLY — 62 items
ALCOHOL ISOPROPYL (RUBBING) (MISCELLANEOUS) ×3 IMPLANT
BANDAGE ACE 6X5 VEL STRL LF (GAUZE/BANDAGES/DRESSINGS) ×3 IMPLANT
BANDAGE ELASTIC 6 VELCRO ST LF (GAUZE/BANDAGES/DRESSINGS) ×3 IMPLANT
BLADE SAW RECIP 87.9 MT (BLADE) ×3 IMPLANT
BONE CEMENT SIMPLEX TOBRAMYCIN (Cement) ×6 IMPLANT
CAPT KNEE TOTAL 3 ×3 IMPLANT
CEMENT BONE SIMPLEX TOBRAMYCIN (Cement) ×3 IMPLANT
CHLORAPREP W/TINT 26ML (MISCELLANEOUS) ×6 IMPLANT
COLLECTOR WOUND DRAINAGE MED (WOUND CARE) ×3 IMPLANT
CUFF TOURNIQUET SINGLE 34IN LL (TOURNIQUET CUFF) ×3 IMPLANT
DERMABOND ADVANCED (GAUZE/BANDAGES/DRESSINGS) ×2
DERMABOND ADVANCED .7 DNX12 (GAUZE/BANDAGES/DRESSINGS) ×1 IMPLANT
DRAIN HEMOVAC 7FR (DRAIN) ×3 IMPLANT
DRAPE SHEET LG 3/4 BI-LAMINATE (DRAPES) ×6 IMPLANT
DRAPE U-SHAPE 47X51 STRL (DRAPES) ×3 IMPLANT
DRAPE UNIVERSAL PACK (DRAPES) ×3 IMPLANT
DRSG AQUACEL AG ADV 3.5X10 (GAUZE/BANDAGES/DRESSINGS) ×3 IMPLANT
DRSG AQUACEL AG ADV 3.5X14 (GAUZE/BANDAGES/DRESSINGS) ×3 IMPLANT
DRSG MEPILEX BORDER 4X4 (GAUZE/BANDAGES/DRESSINGS) ×3 IMPLANT
DRSG TEGADERM 2-3/8X2-3/4 SM (GAUZE/BANDAGES/DRESSINGS) ×3 IMPLANT
ELECT BLADE 4.0 EZ CLEAN MEGAD (MISCELLANEOUS) ×3
ELECT REM PT RETURN 9FT ADLT (ELECTROSURGICAL) ×3
ELECTRODE BLDE 4.0 EZ CLN MEGD (MISCELLANEOUS) ×1 IMPLANT
ELECTRODE REM PT RTRN 9FT ADLT (ELECTROSURGICAL) ×1 IMPLANT
EVACUATOR 1/8 PVC DRAIN (DRAIN) IMPLANT
GLOVE BIO SURGEON STRL SZ 6.5 (GLOVE) ×2 IMPLANT
GLOVE BIO SURGEON STRL SZ8.5 (GLOVE) ×6 IMPLANT
GLOVE BIO SURGEONS STRL SZ 6.5 (GLOVE) ×1
GLOVE BIOGEL PI IND STRL 6.5 (GLOVE) ×2 IMPLANT
GLOVE BIOGEL PI IND STRL 7.0 (GLOVE) ×1 IMPLANT
GLOVE BIOGEL PI IND STRL 8.5 (GLOVE) ×1 IMPLANT
GLOVE BIOGEL PI INDICATOR 6.5 (GLOVE) ×4
GLOVE BIOGEL PI INDICATOR 7.0 (GLOVE) ×2
GLOVE BIOGEL PI INDICATOR 8.5 (GLOVE) ×2
GLOVE INDICATOR 6.5 STRL GRN (GLOVE) ×3 IMPLANT
GLOVE SURG SS PI 6.5 STRL IVOR (GLOVE) ×9 IMPLANT
GLOVE SURG SS PI 7.0 STRL IVOR (GLOVE) ×3 IMPLANT
GOWN STRL REUS W/TWL 2XL LVL3 (GOWN DISPOSABLE) ×3 IMPLANT
HANDPIECE INTERPULSE COAX TIP (DISPOSABLE) ×2
KIT BASIN OR (CUSTOM PROCEDURE TRAY) ×3 IMPLANT
MANIFOLD NEPTUNE II (INSTRUMENTS) ×3 IMPLANT
NEEDLE SPNL 18GX3.5 QUINCKE PK (NEEDLE) ×6 IMPLANT
PACK TOTAL JOINT (CUSTOM PROCEDURE TRAY) ×3 IMPLANT
PACK TOTAL KNEE CUSTOM (KITS) ×3 IMPLANT
PADDING CAST COTTON 6X4 STRL (CAST SUPPLIES) ×3 IMPLANT
PUMP 400CC W/STANDARD CATH (MISCELLANEOUS) ×3 IMPLANT
SAW OSC TIP CART 19.5X105X1.3 (SAW) ×3 IMPLANT
SEALER BIPOLAR AQUA 6.0 (INSTRUMENTS) ×3 IMPLANT
SET HNDPC FAN SPRY TIP SCT (DISPOSABLE) ×1 IMPLANT
SET PAD KNEE POSITIONER (MISCELLANEOUS) ×6 IMPLANT
SPONGE GAUZE 4X4 12PLY STER LF (GAUZE/BANDAGES/DRESSINGS) ×3 IMPLANT
SUT MNCRL AB 3-0 PS2 18 (SUTURE) ×3 IMPLANT
SUT MNCRL AB 3-0 PS2 27 (SUTURE) ×3 IMPLANT
SUT MON AB 2-0 CT1 36 (SUTURE) ×6 IMPLANT
SUT VIC AB 1 CTX 27 (SUTURE) ×3 IMPLANT
SUT VIC AB 2-0 CT1 27 (SUTURE) ×2
SUT VIC AB 2-0 CT1 TAPERPNT 27 (SUTURE) ×1 IMPLANT
SUT VLOC 180 0 24IN GS25 (SUTURE) ×3 IMPLANT
SYR 50ML LL SCALE MARK (SYRINGE) ×6 IMPLANT
TOWER CARTRIDGE SMART MIX (DISPOSABLE) ×6 IMPLANT
TRAY CATH 16FR W/PLASTIC CATH (SET/KITS/TRAYS/PACK) IMPLANT
WRAP KNEE MAXI GEL POST OP (GAUZE/BANDAGES/DRESSINGS) ×3 IMPLANT

## 2016-11-26 NOTE — Interval H&P Note (Signed)
History and Physical Interval Note:  11/26/2016 1:56 PM  Darlene Torres  has presented today for surgery, with the diagnosis of DJD RIGHT KNEE [name]  The various methods of treatment have been discussed with the patient and family. After consideration of risks, benefits and other options for treatment, the patient has consented to  Procedure(s): RIGHT TOTAL KNEE ARTHROPLASTY WITH NAVIGATION COMPLEX (Right) as a surgical intervention .  The patient's history has been reviewed, patient examined, no change in status, stable for surgery.  I have reviewed the patient's chart and labs.  Questions were answered to the patient's satisfaction.     Rogelio Waynick, Cloyde ReamsBrian James

## 2016-11-26 NOTE — Discharge Instructions (Signed)
° °Dr. Jordyn Hofacker °Total Joint Specialist °[facility] °[address] °[place], [place] [address] °[phone] ° °TOTAL KNEE REPLACEMENT POSTOPERATIVE DIRECTIONS ° ° ° °Knee Rehabilitation, Guidelines Following Surgery  °Results after knee surgery are often greatly improved when you follow the exercise, range of motion and muscle strengthening exercises prescribed by your doctor. Safety measures are also important to protect the knee from further injury. Any time any of these exercises cause you to have increased pain or swelling in your knee joint, decrease the amount until you are comfortable again and slowly increase them. If you have problems or questions, call your caregiver or physical therapist for advice.  ° °WEIGHT BEARING °Weight bearing as tolerated with assist device (walker, cane, etc) as directed, use it as long as suggested by your surgeon or therapist, typically at least 4-6 weeks. ° °HOME CARE INSTRUCTIONS  °Remove items at home which could result in a fall. This includes throw rugs or furniture in walking pathways.  °Continue medications as instructed at time of discharge. °You may have some home medications which will be placed on hold until you complete the course of blood thinner medication.  °You may start showering once you are discharged home but do not submerge the incision under water. Just pat the incision dry and apply a dry gauze dressing on daily. °Walk with walker as instructed.  °You may resume a sexual relationship in one month or when given the OK by your doctor.  °· Use walker as long as suggested by your caregivers. °· Avoid periods of inactivity such as sitting longer than an hour when not asleep. This helps prevent blood clots.  °You may put full weight on your legs and walk as much as is comfortable.  °You may return to work once you are cleared by your doctor.  °Do not drive a car for 6 weeks or until released by you surgeon.  °· Do not drive  while taking narcotics.  °Wear the elastic stockings for three weeks following surgery during the day but you may remove then at night. °Make sure you keep all of your appointments after your operation with all of your doctors and caregivers. You should call the office at the above phone number and make an appointment for approximately two weeks after the date of your surgery. °Do not remove your surgical dressing. The dressing is waterproof; you may take showers in 3 days, but do not take tub baths or submerge the dressing. °Please pick up a stool softener and laxative for home use as long as you are requiring pain medications. °· ICE to the affected knee every three hours for 30 minutes at a time and then as needed for pain and swelling.  Continue to use ice on the knee for pain and swelling from surgery. You may notice swelling that will progress down to the foot and ankle.  This is normal after surgery.  Elevate the leg when you are not up walking on it.   °It is important for you to complete the blood thinner medication as prescribed by your doctor. °· Continue to use the breathing machine which will help keep your temperature down.  It is common for your temperature to cycle up and down following surgery, especially at night when you are not up moving around and exerting yourself.  The breathing machine keeps your lungs expanded and your temperature down. ° °RANGE OF MOTION AND STRENGTHENING EXERCISES  °Rehabilitation of the knee is important following   a knee injury or an operation. After just a few days of immobilization, the muscles of the thigh which control the knee become weakened and shrink (atrophy). Knee exercises are designed to build up the tone and strength of the thigh muscles and to improve knee motion. Often times heat used for twenty to thirty minutes before working out will loosen up your tissues and help with improving the range of motion but do not use heat for the first two weeks following  surgery. These exercises can be done on a training (exercise) mat, on the floor, on a table or on a bed. Use what ever works the best and is most comfortable for you Knee exercises include:  °Leg Lifts - While your knee is still immobilized in a splint or cast, you can do straight leg raises. Lift the leg to 60 degrees, hold for 3 sec, and slowly lower the leg. Repeat 10-20 times 2-3 times daily. Perform this exercise against resistance later as your knee gets better.  °Quad and Hamstring Sets - Tighten up the muscle on the front of the thigh (Quad) and hold for 5-10 sec. Repeat this 10-20 times hourly. Hamstring sets are done by pushing the foot backward against an object and holding for 5-10 sec. Repeat as with quad sets.  °A rehabilitation program following serious knee injuries can speed recovery and prevent re-injury in the future due to weakened muscles. Contact your doctor or a physical therapist for more information on knee rehabilitation.  ° °SKILLED REHAB INSTRUCTIONS: °If the patient is transferred to a skilled rehab facility following release from the hospital, a list of the current medications will be sent to the facility for the patient to continue.  When discharged from the skilled rehab facility, please have the facility set up the patient's Home Health Physical Therapy prior to being released. Also, the skilled facility will be responsible for providing the patient with their medications at time of release from the facility to include their pain medication, the muscle relaxants, and their blood thinner medication. If the patient is still at the rehab facility at time of the two week follow up appointment, the skilled rehab facility will also need to assist the patient in arranging follow up appointment in our office and any transportation needs. ° °MAKE SURE YOU:  °Understand these instructions.  °Will watch your condition.  °Will get help right away if you are not doing well or get worse.   ° ° °Pick up stool softner and laxative for home use following surgery while on pain medications. °Do NOT remove your dressing. You may shower.  °Do not take tub baths or submerge incision under water. °May shower starting three days after surgery. °Please use a clean towel to pat the incision dry following showers. °Continue to use ice for pain and swelling after surgery. °Do not use any lotions or creams on the incision until instructed by your surgeon. ° °

## 2016-11-26 NOTE — Transfer of Care (Signed)
Immediate Anesthesia Transfer of Care Note  Patient: Darlene Torres  Procedure(s) Performed: Procedure(s): RIGHT TOTAL KNEE ARTHROPLASTY WITH NAVIGATION COMPLEX (Right)  Patient Location: PACU  Anesthesia Type:MAC and Spinal  Level of Consciousness: awake, alert , oriented and patient cooperative  Airway & Oxygen Therapy: Patient Spontanous Breathing and Patient connected to face mask oxygen  Post-op Assessment: Report given to RN and Post -op Vital signs reviewed and stable  Post vital signs: Reviewed and stable  Last Vitals:  Vitals:   11/26/16 1316 11/26/16 1754  BP: (!) 177/51   Pulse: (!) 59   Resp: 18   Temp: 36.6 C (P) 36.1 C    Last Pain:  Vitals:   11/26/16 1316  TempSrc: Oral         Complications: No apparent anesthesia complications

## 2016-11-26 NOTE — H&P (View-Only) (Signed)
TOTAL KNEE ADMISSION H&P  Patient is being admitted for right total knee arthroplasty.  Subjective:  Chief Complaint:right knee pain.  HPI: Darlene Torres, 82 y.o. female, has a history of pain and functional disability in the right knee due to arthritis and has failed non-surgical conservative treatments for greater than 12 weeks to includeNSAID's and/or analgesics, corticosteriod injections, flexibility and strengthening excercises, use of assistive devices, weight reduction as appropriate and activity modification.  Onset of symptoms was gradual, starting 2 years ago with gradually worsening course since that time. The patient noted no past surgery on the right knee(s).  Patient currently rates pain in the right knee(s) at 10 out of 10 with activity. Patient has night pain, worsening of pain with activity and weight bearing, pain that interferes with activities of daily living, pain with passive range of motion, crepitus and joint swelling.  Patient has evidence of subchondral cysts, subchondral sclerosis, periarticular osteophytes and joint space narrowing by imaging studies.  There is no active infection.  Patient Active Problem List   Diagnosis Date Noted  . Primary osteoarthritis of left hip 09/20/2016   Past Medical History:  Diagnosis Date  . Arthritis   . Diverticulitis of colon   . Hypertension     Past Surgical History:  Procedure Laterality Date  . ABDOMINAL HYSTERECTOMY    . DILATION AND CURETTAGE OF UTERUS    . SAVORY DILATION     esophagus stretching with endoscopy  . TOTAL HIP ARTHROPLASTY Left 09/20/2016   Procedure: LEFT TOTAL HIP ARTHROPLASTY ANTERIOR APPROACH;  Surgeon: Gladstone Rosas, MD;  Location: [facility];  Service: Orthopedics;  Laterality: Left;  RNFA     (Not in a hospital admission) No Known Allergies  Social History  Substance Use Topics  . Smoking status: Never Smoker  . Smokeless tobacco: Never Used  . Alcohol use No    No family history on file.    Review of Systems  Constitutional: Positive for diaphoresis.  HENT: Negative.   Eyes: Negative.   Respiratory: Negative.   Cardiovascular: Negative.   Gastrointestinal: Negative.   Genitourinary: Positive for frequency.  Musculoskeletal: Positive for back pain and joint pain.  Skin: Negative.   Neurological: Negative.   Endo/Heme/Allergies: Negative.   Psychiatric/Behavioral: Negative.     Objective:  Physical Exam  Vitals reviewed. Constitutional: She is oriented to person, place, and time. She appears well-developed and well-nourished.  HENT:  Head: Normocephalic and atraumatic.  Eyes: Conjunctivae and EOM are normal. Pupils are equal, round, and reactive to light.  Neck: Normal range of motion. Neck supple.  Cardiovascular: Normal rate, regular rhythm and intact distal pulses.   Respiratory: Effort normal. No respiratory distress.  GI: Soft. She exhibits no distension.  Genitourinary:  Genitourinary Comments: deferred  Musculoskeletal:       Right knee: She exhibits decreased range of motion and abnormal alignment. Tenderness found. Medial joint line and lateral joint line tenderness noted.  Neurological: She is alert and oriented to person, place, and time. She has normal reflexes.  Skin: Skin is warm and dry.  Psychiatric: She has a normal mood and affect. Her behavior is normal. Judgment and thought content normal.    Vital signs in last 24 hours: @VSRANGES@  Labs:   Estimated body mass index is 23.59 kg/m as calculated from the following:   Height as of 09/20/16: 5' 2" (1.575 m).   Weight as of 09/20/16: 58.5 kg (129 lb).   Imaging Review Plain radiographs demonstrate severe degenerative joint   disease of the right knee(s). The overall alignment issignificant valgus. The bone quality appears to be adequate for age and reported activity level.  Assessment/Plan:  End stage arthritis, right knee   The patient history, physical examination, clinical judgment  of the provider and imaging studies are consistent with end stage degenerative joint disease of the right knee(s) and total knee arthroplasty is deemed medically necessary. The treatment options including medical management, injection therapy arthroscopy and arthroplasty were discussed at length. The risks and benefits of total knee arthroplasty were presented and reviewed. The risks due to aseptic loosening, infection, stiffness, patella tracking problems, thromboembolic complications and other imponderables were discussed. The patient acknowledged the explanation, agreed to proceed with the plan and consent was signed. Patient is being admitted for inpatient treatment for surgery, pain control, PT, OT, prophylactic antibiotics, VTE prophylaxis, progressive ambulation and ADL's and discharge planning. The patient is planning to be discharged home with home health services

## 2016-11-26 NOTE — Op Note (Signed)
OPERATIVE REPORT  SURGEON: Samson FredericBrian Madhuri Vacca, MD   ASSISTANT: Hart CarwinJustin Queen, RNFA.  PREOPERATIVE DIAGNOSIS: Right knee arthritis.   POSTOPERATIVE DIAGNOSIS: Right knee arthritis.   PROCEDURE: Right total knee arthroplasty.   IMPLANTS: Stryker Triathlon CR femur, size 2. Stryker universal tibia, size 2. X3 polyethelyene insert, size 11 mm, CR. 3 button asymmetric patella, size 32 mm. Simplex P bone cement.  ANESTHESIA:  Spinal  TOURNIQUET TIME: 20 min at 320mm Hg.  ESTIMATED BLOOD LOSS: 250 mL.  ANTIBIOTICS: 2 g Ancef.  DRAINS: Medium HV x1.  COMPLICATIONS: None   CONDITION: PACU - hemodynamically stable.   BRIEF CLINICAL NOTE: Darlene Torres is a 82 y.o. female with a long-standing history of Right knee arthritis. After failing conservative management, the patient was indicated for total knee arthroplasty. The risks, benefits, and alternatives to the procedure were explained, and the patient elected to proceed.  PROCEDURE IN DETAIL: The surgical site was marked in the preoperative holding area. Once inside the operative room, spinal anesthesia was obtained, and a foley catheter was inserted. The patient was then positioned, a nonsterile tourniquet was placed, and the lower extremity was prepped and draped in the normal sterile surgical fashion. A time-out was called verifying side and site of surgery. The patient received IV antibiotics within 60 minutes of beginning the procedure.   An anterior approach to the knee was performed utilizing a mid vastus arthrotomy. A medial release was performed and the patellar fat pad was excised. Stryker navigation was used to cut the distal femur perpendicular to the mechanical axis. A freehand patellar resection was performed, and the patella was sized an prepared with 3 lug holes.  Nagivation was used to make a neutral proximal tibia resection,  taking 4 mm of bone from the less affected medial side with 4 degrees of slope. The menisci were excised. A spacer block was placed, and the alignment and balance in extension were confirmed.   The distal femur was sized using the 3-degree external rotation guide referencing the posterior femoral cortex. The appropriate 4-in-1 cutting block was pinned into place. Rotation was checked using Whiteside's line, the epicondylar axis, and then confirmed with a spacer block in flexion. The remaining femoral cuts were performed, taking care to protect the MCL.  The tibia was sized and the trial tray was pinned into place. The remaining trail components were inserted. The knee was stable to varus and valgus stress through a full range of motion. The patella tracked centrally, and the PCL was well balanced. The trial components were removed, and the proximal tibial surface was prepared. 2 large loose bodies were easily removed from the knee. There was a third loose body that was felt to be in the posterior medial aspect of the knee that I did not feel could be safely accessed. Therefore this particular loose body was left in place. The extremity was exsanguinated with an Esmarch, and the tourniquet was inflated. Small drill holes were made in the sclerotic subchondral bone.The cut bony surfaces were irrigated with pulse lavage. Final components were cemented into place and excess cement was cleared. The trial insert was placed, and the knee was brought into extension while the cement polymerized. Once the cement was hard, the knee was tested for a final time and found to be well balanced. The trial insert was exchanged for the real polyethylene insert.  The wound was copiously irrigated with a dilute betadine solution followed by normal saline with pulse lavage. Marcaine solution was injected into  the periarticular soft tissue. The wound was closed in layers using #1 Vicryl and V-Loc for the fascia, 2-0 Vicryl for  the subcutaneous fat, 2-0 Monocryl for the deep dermal layer, 3-0 running Monocryl subcuticular Stitch, and Dermabond for the skin. Once the glue was fully dried, an Aquacell Ag and compressive dressing were applied. The tourniquet was let down, and the patient was transported to the recovery room in stable ondition. Sponge, needle, and instrument counts were correct at the end of the case x2. The patient tolerated the procedure well and there were no known complications.

## 2016-11-27 ENCOUNTER — Encounter (HOSPITAL_COMMUNITY): Payer: Self-pay | Admitting: General Practice

## 2016-11-27 LAB — BASIC METABOLIC PANEL
ANION GAP: 7 (ref 5–15)
BUN: 7 mg/dL (ref 6–20)
CALCIUM: 8.9 mg/dL (ref 8.9–10.3)
CO2: 25 mmol/L (ref 22–32)
CREATININE: 0.75 mg/dL (ref 0.44–1.00)
Chloride: 104 mmol/L (ref 101–111)
GFR calc Af Amer: >60 mL/min (ref >60)
GLUCOSE: 120 mg/dL — AB (ref 65–99)
Potassium: 4 mmol/L (ref 3.5–5.1)
Sodium: 136 mmol/L (ref 135–145)

## 2016-11-27 LAB — CBC
HCT: 32.2 % — ABNORMAL LOW (ref 36.0–46.0)
Hemoglobin: 10.2 g/dL — ABNORMAL LOW (ref 12.0–15.0)
MCH: 29.4 pg (ref 26.0–34.0)
MCHC: 31.7 g/dL (ref 30.0–36.0)
MCV: 92.8 fL (ref 78.0–100.0)
PLATELETS: 256 10*3/uL (ref 150–400)
RBC: 3.47 MIL/uL — ABNORMAL LOW (ref 3.87–5.11)
RDW: 13.1 % (ref 11.5–15.5)
WBC: 9.6 10*3/uL (ref 4.0–10.5)

## 2016-11-27 MED ORDER — HYDROCODONE-ACETAMINOPHEN 5-325 MG PO TABS: 1.0000 | ORAL_TABLET | ORAL | 0 refills | Status: AC | PRN

## 2016-11-27 MED ORDER — ENSURE ENLIVE PO LIQD
237.0000 mL | Freq: Two times a day (BID) | ORAL | Status: DC
  Administered 2016-11-27 – 2016-11-28 (×3): 237 mL via ORAL

## 2016-11-27 MED ORDER — TIMOLOL MALEATE 0.5 % OP SOLN: 1.0000 [drp] | Freq: Every day | OPHTHALMIC | 12 refills | Status: AC

## 2016-11-27 NOTE — Evaluation (Signed)
Physical Therapy Evaluation Patient Details Name: Darlene Torres MRN: [id number] DOB: 05-15-1929 Today's Date: 11/27/2016   History of Present Illness  Patient is an 82 yo female admitted 11/26/16 now s/p Rt TKA.    PMH:  Arthritis, HTN, Lt THA  Clinical Impression  Patient presents with problems listed below.  Will benefit from acute PT to maximize functional mobility prior to d/c home with family.  Recommend f/u HHPT for continued therapy at d/c.    Follow Up Recommendations Home health PT;Supervision for mobility/OOB    Equipment Recommendations  None recommended by PT    Recommendations for Other Services       Precautions / Restrictions Precautions Precautions: Knee;Fall Precaution Booklet Issued: Yes (comment) Precaution Comments: Reviewed precautions with patient and her daughter Restrictions Weight Bearing Restrictions: Yes RLE Weight Bearing: Weight bearing as tolerated      Mobility  Bed Mobility Overal bed mobility: Needs Assistance Bed Mobility: Supine to Sit     Supine to sit: Supervision     General bed mobility comments: Verbal cues for technique.  Supervision for safety only.  Patient able to complete with increased time  Transfers Overall transfer level: Needs assistance Equipment used: Rolling walker (2 wheeled) Transfers: Sit to/from Stand Sit to Stand: Min assist         General transfer comment: Verbal cues for hand placement.  Assist to rise to standing from bed and toilet, and to steady during transfers.  Ambulation/Gait Ambulation/Gait assistance: Min guard Ambulation Distance (Feet): 62 Feet Assistive device: Rolling walker (2 wheeled) Gait Pattern/deviations: Step-to pattern;Decreased stance time - right;Decreased step length - left;Decreased stride length;Decreased weight shift to right;Antalgic Gait velocity: decreased Gait velocity interpretation: Below normal speed for age/gender General Gait Details: Verbal cues for safe use of RW  and gait sequence.  Patient able to maneuver RW and ambulate with min guard assist for safety/balance.  Stairs            Wheelchair Mobility    Modified Rankin (Stroke Patients Only)       Balance Overall balance assessment: Needs assistance         Standing balance support: No upper extremity supported;During functional activity Standing balance-Leahy Scale: Fair Standing balance comment: Requires UE support for dynamic activities/gait                             Pertinent Vitals/Pain Pain Assessment: 0-10 Pain Score: 6  Pain Location: Rt knee during gait Pain Descriptors / Indicators: Aching;Sore Pain Intervention(s): Limited activity within patient's tolerance;Monitored during session;Repositioned    Home Living Family/patient expects to be discharged to:: Private residence Living Arrangements: Children (2 sons live with her) Available Help at Discharge: Family;Available 24 hours/day (2 sons and daughter) Type of Home: House Home Access: Stairs to enter Entrance Stairs-Rails: None Entrance Stairs-Number of Steps: 2 Home Layout: One level Home Equipment: Walker - 2 wheels;Cane - single point;Bedside commode      Prior Function Level of Independence: Independent with assistive device(s)         Comments: Patient using cane for ambulation     Hand Dominance        Extremity/Trunk Assessment   Upper Extremity Assessment: Defer to OT evaluation           Lower Extremity Assessment: RLE deficits/detail RLE Deficits / Details: Decreased strength and knee ROM post-op       Communication   Communication: No difficulties  Cognition  Arousal/Alertness: Awake/alert Behavior During Therapy: WFL for tasks assessed/performed Overall Cognitive Status: Within Functional Limits for tasks assessed                      General Comments      Exercises Total Joint Exercises Ankle Circles/Pumps: AROM;Both;10 reps;Seated Quad Sets:  AROM;Right;5 reps;Seated Short Arc Quad: AROM;Right;5 reps;Seated Heel Slides: AROM;Right;5 reps;Seated Hip ABduction/ADduction: AROM;Right;5 reps;Seated Long Arc Quad: AROM;Right;5 reps;Seated;Limitations Long [employer] Quad Limitations: Unable to fully extend knee actively Knee Flexion: AROM;Right;5 reps;Seated Goniometric ROM: -10* ext;  70* flexion   Assessment/Plan    PT Assessment Patient needs continued PT services  PT Problem List Decreased strength;Decreased range of motion;Decreased activity tolerance;Decreased balance;Decreased mobility;Decreased knowledge of use of DME;Decreased knowledge of precautions;Pain          PT Treatment Interventions DME instruction;Gait training;Stair training;Functional mobility training;Therapeutic activities;Therapeutic exercise;Patient/family education    PT Goals (Current goals can be found in the Care Plan section)  Acute Rehab PT Goals Patient Stated Goal: To return home tomorrow PT Goal Formulation: With patient/family Time For Goal Achievement: 12/04/16 Potential to Achieve Goals: Good    Frequency 7X/week   Barriers to discharge        Co-evaluation               End of Session Equipment Utilized During Treatment: Gait belt Activity Tolerance: Patient tolerated treatment well Patient left: in chair;with call bell/phone within reach;with family/visitor present Nurse Communication: Mobility status (In chair)         Time: [id number] PT Time Calculation (min) (ACUTE ONLY): 29 min   Charges:   PT Evaluation $PT Eval Low Complexity: 1 Procedure PT Treatments $Gait Training: 8-22 mins   PT G Codes:        Vena AustriaSusan H Jerrod Damiano 11/27/2016, 11:07 AM Durenda HurtSusan H. Renaldo Fiddleravis, PT, [facility]MBA Acute Rehab Services Pager [phone]

## 2016-11-27 NOTE — Anesthesia Procedure Notes (Signed)
Spinal  Patient location during procedure: OR Start time: 11/26/2016 3:28 PM End time: 11/26/2016 3:31 PM Staffing Anesthesiologist: Arta BruceSSEY, Rylah Fukuda Performed: anesthesiologist  Preanesthetic Checklist Completed: patient identified, surgical consent, pre-op evaluation, timeout performed, IV checked, risks and benefits discussed and monitors and equipment checked Spinal Block Patient position: sitting Prep: Betadine Patient monitoring: heart rate, cardiac monitor, continuous pulse ox and blood pressure Approach: right paramedian Location: L3-4 Injection technique: single-shot Needle Needle type: Pencan  Needle gauge: 24 G Needle length: 9 cm Needle insertion depth: 7 cm

## 2016-11-27 NOTE — Anesthesia Preprocedure Evaluation (Signed)
Anesthesia Evaluation  Patient identified by MRN, date of birth, ID band Patient awake    Reviewed: Allergy & Precautions, NPO status , Patient's Chart, lab work & pertinent test results  Airway Mallampati: I  TM Distance: >3 FB Neck ROM: Full    Dental   Pulmonary    Pulmonary exam normal        Cardiovascular hypertension, Pt. on medications Normal cardiovascular exam     Neuro/Psych    GI/Hepatic   Endo/Other    Renal/GU      Musculoskeletal   Abdominal   Peds  Hematology   Anesthesia Other Findings   Reproductive/Obstetrics                             Anesthesia Physical Anesthesia Plan  ASA: II  Anesthesia Plan: MAC and Spinal   Post-op Pain Management:    Induction: Intravenous  Airway Management Planned: Simple Face Mask  Additional Equipment:   Intra-op Plan:   Post-operative Plan:   Informed Consent: I have reviewed the patients History and Physical, chart, labs and discussed the procedure including the risks, benefits and alternatives for the proposed anesthesia with the patient or authorized representative who has indicated his/her understanding and acceptance.     Plan Discussed with: CRNA and Surgeon  Anesthesia Plan Comments:         Anesthesia Quick Evaluation

## 2016-11-27 NOTE — Discharge Summary (Signed)
Physician Discharge Summary  Patient ID: Darlene Torres MRN: [id number] DOB/AGE: 1929/01/21 82 y.o.  Admit date: 11/26/2016 Discharge date: 11/28/2016  Admission Diagnoses:  Osteoarthritis of right knee  Discharge Diagnoses:  Principal Problem:   Osteoarthritis of right knee   Past Medical History:  Diagnosis Date  . Arthritis   . Diverticulitis of colon   . Glaucoma    bilateral  . History of bronchitis   . Hypertension   . Seasonal allergies   . Urinary incontinence   . Urinary urgency     Surgeries: Procedure(s): RIGHT TOTAL KNEE ARTHROPLASTY WITH NAVIGATION COMPLEX on 11/26/2016   Consultants (if any):   Discharged Condition: Improved  Hospital Course: Darlene Torres is an 82 y.o. female who was admitted 11/26/2016 with a diagnosis of Osteoarthritis of right knee and went to the operating room on 11/26/2016 and underwent the above named procedures.    She was given perioperative antibiotics:  Anti-infectives    Start     Dose/Rate Route Frequency Ordered Stop   11/26/16 2200  ceFAZolin (ANCEF) IVPB 1 g/50 mL premix     1 g 100 mL/hr over 30 Minutes Intravenous Every 6 hours 11/26/16 2123 11/27/16 0617   11/26/16 1430  ceFAZolin (ANCEF) IVPB 2g/100 mL premix     2 g 200 mL/hr over 30 Minutes Intravenous On call to O.R. 11/26/16 1323 11/26/16 1535    .  She was given sequential compression devices, early ambulation, and ASA for DVT prophylaxis.  She benefited maximally from the hospital stay and there were no complications.    Recent vital signs:  Vitals:   11/27/16 2022 11/28/16 0500  BP: (!) 179/65 (!) 141/53  Pulse: 71 68  Resp: 16 16  Temp: 98.6 F (37 C) 98.6 F (37 C)    Recent laboratory studies:  Lab Results  Component Value Date   HGB 10.1 (L) 11/28/2016   HGB 10.2 (L) 11/27/2016   HGB 12.3 11/20/2016   Lab Results  Component Value Date   WBC 13.0 (H) 11/28/2016   PLT 288 11/28/2016   No results found for: INR Lab Results  Component  Value Date   NA 136 11/27/2016   K 4.0 11/27/2016   CL 104 11/27/2016   CO2 25 11/27/2016   BUN 7 11/27/2016   CREATININE 0.75 11/27/2016   GLUCOSE 120 (H) 11/27/2016    Discharge Medications:     Medication List    STOP taking these medications   BEN GAY GREASELESS 10-15 % greaseless cream   traMADol 50 MG tablet Commonly known as:  ULTRAM   trolamine salicylate 10 % cream Commonly known as:  ASPERCREME     TAKE these medications   aspirin 81 MG chewable tablet Chew 1 tablet (81 mg total) by mouth 2 (two) times daily.   cetirizine 10 MG tablet Commonly known as:  ZYRTEC Take 10 mg by mouth daily.   CITRUCEL oral powder Generic drug:  methylcellulose Take 1 packet by mouth daily as needed (for digestive regularity).   docusate sodium 100 MG capsule Commonly known as:  COLACE Take 1 capsule (100 mg total) by mouth 2 (two) times daily.   fluticasone 50 MCG/ACT nasal spray Commonly known as:  FLONASE Place 1-2 sprays into both nostrils daily as needed (for runny nose).   HYDROcodone-acetaminophen 5-325 MG tablet Commonly known as:  NORCO/VICODIN Take 1-2 tablets by mouth every 4 (four) hours as needed (breakthrough pain).   latanoprost 0.005 % ophthalmic solution Commonly known  as:  XALATAN Place 1 drop into both eyes at bedtime.   lisinopril 10 MG tablet Commonly known as:  PRINIVIL,ZESTRIL Take 10 mg by mouth daily.   meloxicam 7.5 MG tablet Commonly known as:  MOBIC Take 7.5 mg by mouth daily as needed for pain.   metoprolol succinate 25 MG 24 hr tablet Commonly known as:  TOPROL-XL Take 25 mg by mouth daily.   ondansetron 4 MG tablet Commonly known as:  ZOFRAN Take 1 tablet (4 mg total) by mouth every 6 (six) hours as needed for nausea.   oxybutynin 5 MG 24 hr tablet Commonly known as:  DITROPAN-XL Take 5 mg by mouth at bedtime as needed (for urinary frequency/incontinence).   senna 8.6 MG Tabs tablet Commonly known as:  SENOKOT Take 2  tablets (17.2 mg total) by mouth at bedtime.   timolol 0.5 % ophthalmic solution Commonly known as:  BETIMOL Place 1 drop into both eyes daily.   timolol 0.5 % ophthalmic solution Commonly known as:  TIMOPTIC Place 1 drop into both eyes daily.   Vitamin D (Ergocalciferol) 50000 units Caps capsule Commonly known as:  DRISDOL Take 50,000 Units by mouth every Friday.       Diagnostic Studies: Dg Knee Right Port  Result Date: 11/26/2016 CLINICAL DATA:  Right total knee replacement. EXAM: PORTABLE RIGHT KNEE - 1-2 VIEW COMPARISON:  None. FINDINGS: Right total knee replacement identified. Postoperative changes and drain noted. No complicating features are identified. IMPRESSION: Right total knee replacement without definite complicating features. Electronically Signed   By: Harmon PierJeffrey  Hu M.D.   On: 11/26/2016 18:37    Disposition: 01-Home or Self Care  Discharge Instructions    Call MD / Call 911    Complete by:  As directed    If you experience chest pain or shortness of breath, CALL 911 and be transported to the hospital emergency room.  If you develope a fever above 101 F, pus (white drainage) or increased drainage or redness at the wound, or calf pain, call your surgeon's office.   Constipation Prevention    Complete by:  As directed    Drink plenty of fluids.  Prune juice may be helpful.  You may use a stool softener, such as Colace (over the counter) 100 mg twice a day.  Use MiraLax (over the counter) for constipation as needed.   Diet - low sodium heart healthy    Complete by:  As directed    Do not put a pillow under the knee. Place it under the heel.    Complete by:  As directed    Driving restrictions    Complete by:  As directed    No driving for 6 weeks   Increase activity slowly as tolerated    Complete by:  As directed    Lifting restrictions    Complete by:  As directed    No lifting for 6 weeks   TED hose    Complete by:  As directed    Use stockings (TED hose)  for 2 weeks on both leg(s).  You may remove them at night for sleeping.      Follow-up Information    Bartley Vuolo, Cloyde ReamsBrian James, MD. Schedule an appointment as soon as possible for a visit in 2 week(s).   Specialty:  Orthopedic Surgery Why:  For wound re-check Contact information: [address] [place] [address] [phone]        [facility] Follow up.   Specialty:  Home Health Services Why:  Someone from [facility] will contact you to arrange start date and time for therapy.  Contact information: [address] [place] [address] [phone]            Signed: Garnet KoyanagiSwinteck, Aiyla Baucom James 11/28/2016, 4:07 PM

## 2016-11-27 NOTE — Progress Notes (Signed)
   Subjective:  Patient reports pain as mild to moderate.  Denies N/V/CP/SOB.  Objective:   VITALS:   Vitals:   11/26/16 2200 11/27/16 0000 11/27/16 0100 11/27/16 0449  BP: (!) 150/63 (!) 164/80 (!) 147/47 (!) 144/56  Pulse:  76 (!) 54 65  Resp:  16    Temp:  97.8 F (36.6 C)  98 F (36.7 C)  TempSrc:  Oral  Oral  SpO2:  100%  98%  Weight:        ABD soft Sensation intact distally Intact pulses distally Dorsiflexion/Plantar flexion intact Incision: dressing C/D/I Compartment soft   Lab Results  Component Value Date   WBC 9.6 11/27/2016   HGB 10.2 (L) 11/27/2016   HCT 32.2 (L) 11/27/2016   MCV 92.8 11/27/2016   PLT 256 11/27/2016   BMET    Component Value Date/Time   NA 136 11/27/2016 0350   K 4.0 11/27/2016 0350   CL 104 11/27/2016 0350   CO2 25 11/27/2016 0350   GLUCOSE 120 (H) 11/27/2016 0350   BUN 7 11/27/2016 0350   CREATININE 0.75 11/27/2016 0350   CALCIUM 8.9 11/27/2016 0350   GFRNONAA >60 11/27/2016 0350   GFRAA >60 11/27/2016 0350     Assessment/Plan: 1 Day Post-Op   Principal Problem:   Osteoarthritis of right knee    WBAT with walker DVT ppx: ASA 81 mg PO BID, SCDs, TEDs PO pain control ABLA: asymptomatic, monitor PT/OT D/C home tomorrow   Garnet KoyanagiSwinteck, Cara Thaxton James 11/27/2016, 7:50 AM   Samson FredericBrian Sandar Krinke, MD Cell [phone]

## 2016-11-27 NOTE — Anesthesia Postprocedure Evaluation (Signed)
Anesthesia Post Note  Patient: Darlene Torres  Procedure(s) Performed: Procedure(s) (LRB): RIGHT TOTAL KNEE ARTHROPLASTY WITH NAVIGATION COMPLEX (Right)  Patient location during evaluation: PACU Anesthesia Type: Spinal Level of consciousness: oriented and awake and alert Pain management: pain level controlled Vital Signs Assessment: post-procedure vital signs reviewed and stable Respiratory status: spontaneous breathing, respiratory function stable and patient connected to nasal cannula oxygen Cardiovascular status: blood pressure returned to baseline and stable Postop Assessment: no headache and no backache Anesthetic complications: no    Last Vitals:  Vitals:   11/27/16 0100 11/27/16 0449  BP: (!) 147/47 (!) 144/56  Pulse: (!) 54 65  Resp:    Temp:  36.7 C    Last Pain:  Vitals:   11/27/16 0449  TempSrc: Oral  PainSc:                  Hillard Goodwine DAVID

## 2016-11-27 NOTE — Progress Notes (Signed)
Occupational Therapy Evaluation Patient Details Name: Darlene Torres MRN: [id number] DOB: 14-May-1929 Today's Date: 11/27/2016    History of Present Illness Patient is an 82 yo female admitted 11/26/16 now s/p Rt TKA.    PMH:  Arthritis, HTN, Lt THA   Clinical Impression   Pt making excellent progress. Will see again in am to complete education regarding management of ADL and functional mobility for tub transfers. Daughter present. Pt will have 24/7 S after DC if needed. Pt will be appropriate for DC tomorrow if medically stable.     Follow Up Recommendations  No OT follow up;Supervision - Intermittent    Equipment Recommendations  None recommended by OT    Recommendations for Other Services       Precautions / Restrictions Precautions Precautions: Knee;Fall Precaution Booklet Issued: Yes (comment) Precaution Comments: Reviewed precautions with patient and her daughter Restrictions Weight Bearing Restrictions: Yes RLE Weight Bearing: Weight bearing as tolerated      Mobility Bed Mobility Overal bed mobility: Needs Assistance Bed Mobility: Supine to Sit     Supine to sit: Supervision     General bed mobility comments: OOB in chair  Transfers Overall transfer level: Needs assistance Equipment used: Rolling walker (2 wheeled) Transfers: Sit to/from Stand Sit to Stand: Supervision         General transfer comment: Verbal cues for hand placement.  Assist to rise to standing from bed and toilet, and to steady during transfers.    Balance Overall balance assessment: Needs assistance         Standing balance support: No upper extremity supported;During functional activity Standing balance-Leahy Scale: Fair Standing balance comment: Requires UE support for dynamic activities/gait                            ADL Overall ADL's : Needs assistance/impaired             Lower Body Bathing: Minimal assistance;Sit to/from stand       Lower Body  Dressing: Minimal assistance;Sit to/from stand   Toilet Transfer: Supervision/safety;Ambulation;Comfort height toilet;RW   Toileting- Clothing Manipulation and Hygiene: Supervision/safety       Functional mobility during ADLs: Supervision/safety;Rolling walker General ADL Comments: States LB ADL were difficult prior to surgery. Will educate on AE      Vision     Perception     Praxis      Pertinent Vitals/Pain Pain Assessment: 0-10 Pain Score: 5  Pain Location: R knee Pain Descriptors / Indicators: Aching;Discomfort Pain Intervention(s): Limited activity within patient's tolerance;Repositioned;Ice applied     Hand Dominance Right   Extremity/Trunk Assessment Upper Extremity Assessment Upper Extremity Assessment: Overall WFL for tasks assessed   Lower Extremity Assessment Lower Extremity Assessment: Defer to PT evaluation RLE Deficits / Details: Decreased strength and knee ROM post-op   Cervical / Trunk Assessment Cervical / Trunk Assessment: Normal   Communication Communication Communication: No difficulties   Cognition Arousal/Alertness: Awake/alert Behavior During Therapy: WFL for tasks assessed/performed Overall Cognitive Status: Within Functional Limits for tasks assessed                     General Comments       Exercises Exercises: Total Joint     Shoulder Instructions      Home Living Family/patient expects to be discharged to:: Private residence Living Arrangements: Children (2 sons live with her) Available Help at Discharge: Family;Available 24 hours/day (2 sons and  daughter) Type of Home: House Home Access: Stairs to enter Entergy CorporationEntrance Stairs-Number of Steps: 2 Entrance Stairs-Rails: None Home Layout: One level     Bathroom Shower/Tub: Tub/shower unit;Door   Foot LockerBathroom Toilet: Standard     Home Equipment: Environmental consultantWalker - 2 wheels;Cane - single point;Bedside commode          Prior Functioning/Environment Level of Independence:  Independent with assistive device(s)        Comments: Patient using cane for ambulation; very active        OT Problem List: Decreased strength;Decreased range of motion;Impaired balance (sitting and/or standing);Decreased knowledge of use of DME or AE;Pain   OT Treatment/Interventions: Self-care/ADL training;DME and/or AE instruction;Therapeutic activities;Patient/family education    OT Goals(Current goals can be found in the care plan section) Acute Rehab OT Goals Patient Stated Goal: To return home tomorrow OT Goal Formulation: With patient Time For Goal Achievement: 12/04/16 Potential to Achieve Goals: Good ADL Goals Pt Will Perform Lower Body Dressing: with supervision;with caregiver independent in assisting;sit to/from stand;with adaptive equipment Pt Will Perform Tub/Shower Transfer: Tub transfer;with min guard assist;with caregiver independent in assisting;3 in 1;rolling walker;ambulating  OT Frequency: Min 2X/week   Barriers to D/C:            Co-evaluation              End of Session Equipment Utilized During Treatment: Gait belt;Rolling walker Nurse Communication: Mobility status  Activity Tolerance: Patient tolerated treatment well Patient left: in chair;with call bell/phone within reach   Time: 1110-1127 OT Time Calculation (min): 17 min Charges:  OT General Charges $OT Visit: 1 Procedure OT Evaluation $OT Eval Moderate Complexity: 1 Procedure G-Codes:    Karnisha Lefebre,HILLARY 11/27/2016, 11:50 AM   Darlene Torres, OTR/L  [phone] 11/27/2016

## 2016-11-27 NOTE — Care Management Note (Signed)
Case Management Note  Patient Details  Name: Darlene CandleMyrtle G Heckert MRN: [id number] Date of Birth: [birth year]  Subjective/Objective:   82 yr old female s/p right total knee arthroplasty.                  Action/Plan: Case manager spoke with patient concerning Home Health and DME needs. Patient was preoperatively setup with [facility], no changes. She has rolling walker and 3in1 at home, will have family support at discharge.     Expected Discharge Date:  11/28/16             Expected Discharge Plan:  Home w Home Health Services  In-House Referral:  NA  Discharge planning Services  CM Consult  Post Acute Care Choice:  Home Health Choice offered to:  Patient  DME Arranged:    DME Agency:  NA  HH Arranged:  PT [facility]  Status of Service:  Completed, signed off  If discussed at [employer] Length of Stay Meetings, dates discussed:    Additional Comments:  Durenda GuthrieBrady, Hai Grabe Naomi, RN 11/27/2016, 4:04 PM

## 2016-11-27 NOTE — Progress Notes (Addendum)
Physical Therapy Treatment Patient Details Name: Darlene CandleMyrtle G Allcock MRN: [id number] DOB: December 15, 1929 Today's Date: 11/27/2016    History of Present Illness Patient is an 82 yo female admitted 11/26/16 now s/p Rt TKA.    PMH:  Arthritis, HTN, Lt THA    PT Comments    Patient making good gains with mobility and gait.  Will need to complete stair training tomorrow, and then patient should be ready for d/c from PT perspective, with f/u HHPT.  Follow Up Recommendations  Home health PT;Supervision for mobility/OOB     Equipment Recommendations  None recommended by PT    Recommendations for Other Services       Precautions / Restrictions Precautions Precautions: Knee;Fall Precaution Booklet Issued: Yes (comment) (This am) Precaution Comments: Reviewed precautions with patient and her daughter Restrictions Weight Bearing Restrictions: Yes RLE Weight Bearing: Weight bearing as tolerated    Mobility  Bed Mobility               General bed mobility comments: Patient in chair  Transfers Overall transfer level: Needs assistance Equipment used: Rolling walker (2 wheeled) Transfers: Sit to/from Stand Sit to Stand: Supervision         General transfer comment: Patient able to move to standing from chair with no physical assist.  Assist for safety.  Ambulation/Gait Ambulation/Gait assistance: Min guard Ambulation Distance (Feet): 94 Feet Assistive device: Rolling walker (2 wheeled) Gait Pattern/deviations: Step-to pattern;Decreased stance time - right;Decreased step length - left;Decreased stride length;Antalgic Gait velocity: decreased Gait velocity interpretation: Below normal speed for age/gender General Gait Details: Patient demonstrates safe use of RW.  Min guard for safety.   Stairs            Wheelchair Mobility    Modified Rankin (Stroke Patients Only)       Balance           Standing balance support: No upper extremity supported Standing  balance-Leahy Scale: Fair Standing balance comment: Requires UE support for gait                    Cognition Arousal/Alertness: Awake/alert Behavior During Therapy: WFL for tasks assessed/performed Overall Cognitive Status: Within Functional Limits for tasks assessed                      Exercises Total Joint Exercises Ankle Circles/Pumps: AROM;Both;10 reps;Seated Quad Sets: AROM;Right;10 reps;Seated    General Comments        Pertinent Vitals/Pain Pain Assessment: 0-10 Pain Score: 6  Pain Location: Rt knee Pain Descriptors / Indicators: Aching;Sore Pain Intervention(s): Monitored during session;Repositioned;Ice applied    Home Living                      Prior Function            PT Goals (current goals can now be found in the care plan section) Acute Rehab PT Goals Patient Stated Goal: To return home tomorrow Progress towards PT goals: Progressing toward goals    Frequency    7X/week      PT Plan Current plan remains appropriate    Co-evaluation             End of Session Equipment Utilized During Treatment: Gait belt Activity Tolerance: Patient tolerated treatment well Patient left: in chair;with call bell/phone within reach;with family/visitor present     Time: [id number] PT Time Calculation (min) (ACUTE ONLY): 13 min  Charges:  $Gait Training: 8-22  mins                    G Codes:      Vena AustriaSusan H Brinson Tozzi 11/27/2016, 4:32 PM Durenda HurtSusan H. Renaldo Fiddleravis, PT, [facility] Acute Rehab Services Pager [phone]

## 2016-11-27 NOTE — Progress Notes (Signed)
Initial Nutrition Assessment  DOCUMENTATION CODES:   Not applicable  INTERVENTION:  Continue Ensure Enlive po BID, each supplement provides 350 kcal and 20 grams of protein.  Encourage adequate PO intake.   NUTRITION DIAGNOSIS:   Increased nutrient needs related to acute illness as evidenced by estimated needs.  GOAL:   Patient will meet greater than or equal to 90% of their needs  MONITOR:   PO intake, Supplement acceptance, Labs, Weight trends, Skin, I & O's  REASON FOR ASSESSMENT:   Malnutrition Screening Tool    ASSESSMENT:   82 yo female with PMH of HTN and arthritis admitted 11/26/16 with dx of with the diagnosis of DJD right knee dalgus, now s/p Rt TKA.   Procedure (12/4): RIGHT TOTAL KNEE ARTHROPLASTY WITH NAVIGATION COMPLEX (Right)  Pt reports appetite is good currently. Meal completion has been 75%. Pt reports usual consumption of at least 2 meals a day. Pt reports she has been having gradual weight loss since January 2017 with usual body weight of ~150 lbs. Pt with a reported 12% weight loss in 11 months (not significant for time frame). Pt currently has Ensure ordered. RD to continue with current orders. Pt was educated on continuation of nutritional supplements at home if po intake is poor. Pt expressed understanding.  Pt with no observed significant fat or muscle mass loss.   Labs and medications reviewed.   Diet Order:  DIET SOFT Room service appropriate? Yes; Fluid consistency: Thin Diet - low sodium heart healthy  Skin:   (Incision on R knee)  Last BM:  12/4  Height:   Ht Readings from Last 1 Encounters:  11/20/16 5\' 2"  (1.575 m)    Weight:   Wt Readings from Last 1 Encounters:  11/26/16 132 lb (59.9 kg)    Ideal Body Weight:  50 kg  BMI:  Body mass index is 24.14 kg/m.  Estimated Nutritional Needs:   Kcal:  1500-1800  Protein:  65-75 grams  Fluid:  >/= 1.5 L/day  EDUCATION NEEDS:   Education needs addressed  Roslyn SmilingStephanie  Beadie Matsunaga, MS, RD, LDN Pager # [phone] After hours/ weekend pager # [phone]

## 2016-11-27 NOTE — Progress Notes (Signed)
Patient successfully voided into bedpan after foley catheter removal.

## 2016-11-28 ENCOUNTER — Encounter (HOSPITAL_COMMUNITY): Payer: Self-pay | Admitting: Orthopedic Surgery

## 2016-11-28 LAB — CBC
HCT: 31.4 % — ABNORMAL LOW (ref 36.0–46.0)
HEMOGLOBIN: 10.1 g/dL — AB (ref 12.0–15.0)
MCH: 29.4 pg (ref 26.0–34.0)
MCHC: 32.2 g/dL (ref 30.0–36.0)
MCV: 91.3 fL (ref 78.0–100.0)
Platelets: 288 10*3/uL (ref 150–400)
RBC: 3.44 MIL/uL — AB (ref 3.87–5.11)
RDW: 13.2 % (ref 11.5–15.5)
WBC: 13 10*3/uL — ABNORMAL HIGH (ref 4.0–10.5)

## 2016-11-28 NOTE — Progress Notes (Signed)
Physical Therapy Treatment Patient Details Name: Darlene Torres MRN: [id number] DOB: March 31, 1929 Today's Date: 11/28/2016    History of Present Illness Patient is an 82 yo female admitted 11/26/16 now s/p Rt TKA.    PMH:  Arthritis, HTN, Lt THA    PT Comments    Patient is making good progress with PT.  From a mobility standpoint anticipate patient will be ready for DC home when medically ready.     Follow Up Recommendations  Home health PT;Supervision for mobility/OOB     Equipment Recommendations  None recommended by PT    Recommendations for Other Services       Precautions / Restrictions Precautions Precautions: Knee;Fall Precaution Comments: Reviewed precautions with patient and her daughter Restrictions Weight Bearing Restrictions: Yes RLE Weight Bearing: Weight bearing as tolerated    Mobility  Bed Mobility               General bed mobility comments: pt on commode upon arrival with daughter present  Transfers Overall transfer level: Needs assistance Equipment used: Rolling walker (2 wheeled) Transfers: Sit to/from Stand Sit to Stand: Supervision         General transfer comment: safe hand placement and technique demonstrated; supervision for safety  Ambulation/Gait Ambulation/Gait assistance: Supervision Ambulation Distance (Feet): 200 Feet Assistive device: Rolling walker (2 wheeled) Gait Pattern/deviations: Step-through pattern Gait velocity: decreased   General Gait Details: pt with steady gait and improved step length symmetry with little reliance on UE support   Stairs Stairs: Yes Stairs assistance: Min guard   Number of Stairs:  (2X2) General stair comments: pt and daughter educated on sequencing and technique; min guard for safety; daughter stabilized RW  Wheelchair Mobility    Modified Rankin (Stroke Patients Only)       Balance             Standing balance-Leahy Scale: Fair                      Cognition  Arousal/Alertness: Awake/alert Behavior During Therapy: WFL for tasks assessed/performed Overall Cognitive Status: Within Functional Limits for tasks assessed                      Exercises Total Joint Exercises Straight Leg Raises: AROM;Right;10 reps Long Arc Quad: AROM;Right;10 reps Knee Flexion: AROM;Right;5 reps;Other (comment) (10 sec holds) Goniometric ROM: 90 degrees flexion in sitting    General Comments General comments (skin integrity, edema, etc.): Reviewed HEP handout and frequency      Pertinent Vitals/Pain Pain Assessment: 0-10 Pain Score: 5  Pain Location: R knee Pain Descriptors / Indicators: Sore Pain Intervention(s): Limited activity within patient's tolerance;Monitored during session;Premedicated before session;Repositioned    Home Living                      Prior Function            PT Goals (current goals can now be found in the care plan section) Acute Rehab PT Goals Patient Stated Goal: go home Progress towards PT goals: Progressing toward goals    Frequency    7X/week      PT Plan Current plan remains appropriate    Co-evaluation             End of Session Equipment Utilized During Treatment: Gait belt Activity Tolerance: Patient tolerated treatment well Patient left: in chair;with call bell/phone within reach;with family/visitor present     Time: [id number] PT  Time Calculation (min) (ACUTE ONLY): 25 min  Charges:  $Gait Training: 8-22 mins $Therapeutic Exercise: 8-22 mins                    G Codes:      Carolynne EdouardKellyn R Pati Thinnes 11/28/2016, 9:23 AM

## 2016-11-28 NOTE — Progress Notes (Signed)
Discharge instruction provided to pt and all questions answered. Patient received her paper prescriptions and ready to discharge.

## 2016-11-28 NOTE — Progress Notes (Signed)
   Subjective:  Patient reports pain as mild to moderate.  Denies N/V/CP/SOB. Wants to go home.  Objective:   VITALS:   Vitals:   11/27/16 0449 11/27/16 1300 11/27/16 2022 11/28/16 0500  BP: (!) 144/56 (!) 142/54 (!) 179/65 (!) 141/53  Pulse: 65 68 71 68  Resp:  16 16 16   Temp: 98 F (36.7 C) 98.1 F (36.7 C) 98.6 F (37 C) 98.6 F (37 C)  TempSrc: Oral Oral Oral Oral  SpO2: 98% 99% 98% 98%  Weight:        ABD soft Sensation intact distally Intact pulses distally Dorsiflexion/Plantar flexion intact Incision: dressing C/D/I Compartment soft   Lab Results  Component Value Date   WBC 13.0 (H) 11/28/2016   HGB 10.1 (L) 11/28/2016   HCT 31.4 (L) 11/28/2016   MCV 91.3 11/28/2016   PLT 288 11/28/2016   BMET    Component Value Date/Time   NA 136 11/27/2016 0350   K 4.0 11/27/2016 0350   CL 104 11/27/2016 0350   CO2 25 11/27/2016 0350   GLUCOSE 120 (H) 11/27/2016 0350   BUN 7 11/27/2016 0350   CREATININE 0.75 11/27/2016 0350   CALCIUM 8.9 11/27/2016 0350   GFRNONAA >60 11/27/2016 0350   GFRAA >60 11/27/2016 0350     Assessment/Plan: 2 Days Post-Op   Principal Problem:   Osteoarthritis of right knee    WBAT with walker DVT ppx: ASA 81 mg PO BID, SCDs, TEDs PO pain control ABLA: stable, monitor PT/OT D/C home with HHPT   Shalonda Sachse, Cloyde ReamsBrian James 11/28/2016, 7:59 AM   Samson FredericBrian Marjie Chea, MD Cell [phone]

## 2016-11-28 NOTE — Progress Notes (Addendum)
Occupational Therapy Treatment/Discharge Patient Details Name: Darlene CandleMyrtle G Nienow MRN: [id number] DOB: 10/14/1929 Today's Date: 11/28/2016    History of present illness Patient is an 82 yo female admitted 11/26/16 now s/p Rt TKA.    PMH:  Arthritis, HTN, Lt THA   OT comments  Pt progressing well toward OT goals. Educated pt and daughter concerning safe tub transfers with 3-in-1 and pt able to complete with min guard assist. Provided handout and pt and daughter demonstrate understanding. Pt additionally educated on use of AE for LB ADL. She was able to don/doff socks with supervision. Discussed energy conservation techniques and pt demonstrates understanding. No further acute OT needs identified. OT will sign off.   Follow Up Recommendations  No OT follow up;Supervision - Intermittent    Equipment Recommendations  None recommended by OT    Recommendations for Other Services      Precautions / Restrictions Precautions Precautions: Knee;Fall Precaution Comments: Reviewed precautions with patient and her daughter Restrictions Weight Bearing Restrictions: Yes RLE Weight Bearing: Weight bearing as tolerated       Mobility Bed Mobility               General bed mobility comments: Received in recliner  Transfers Overall transfer level: Needs assistance Equipment used: Rolling walker (2 wheeled) Transfers: Sit to/from Stand Sit to Stand: Supervision         General transfer comment: safe hand placement and technique demonstrated; supervision for safety    Balance Overall balance assessment: Needs assistance Sitting-balance support: No upper extremity supported;Feet supported Sitting balance-Leahy Scale: Good     Standing balance support: No upper extremity supported;Bilateral upper extremity supported Standing balance-Leahy Scale: Fair Standing balance comment: Requires UE support for dynamic activities.                   ADL Overall ADL's : Needs  assistance/impaired             Lower Body Bathing: Supervison/ safety;With adaptive equipment;Sit to/from stand       Lower Body Dressing: Supervision/safety;Sit to/from stand;With adaptive equipment   Toilet Transfer: Supervision/safety;Ambulation;Comfort height toilet;RW       Tub/ Shower Transfer: Supervision/safety;3 in 1;Ambulation;Rolling walker;Min guard   Functional mobility during ADLs: Supervision/safety;Rolling walker General ADL Comments: Educated pt and daughter on AE.      Vision                     Perception     Praxis      Cognition   Behavior During Therapy: [facility] LLCWFL for tasks assessed/performed Overall Cognitive Status: Within Functional Limits for tasks assessed                       Extremity/Trunk Assessment               Exercises Total Joint Exercises Straight Leg Raises: AROM;Right;10 reps Long Arc Quad: AROM;Right;10 reps Knee Flexion: AROM;Right;5 reps;Other (comment) (10 sec holds) Goniometric ROM: 90 degrees flexion in sitting   Shoulder Instructions       General Comments      Pertinent Vitals/ Pain       Pain Assessment: 0-10 Pain Score: 4  Pain Location: R knee Pain Descriptors / Indicators: Sore Pain Intervention(s): Limited activity within patient's tolerance;Monitored during session;Repositioned;Ice applied  Home Living  Prior Functioning/Environment              Frequency  Min 2X/week        Progress Toward Goals  OT Goals(current goals can now be found in the care plan section)  Progress towards OT goals: Progressing toward goals  Acute Rehab OT Goals Patient Stated Goal: go home OT Goal Formulation: With patient Time For Goal Achievement: 12/04/16 Potential to Achieve Goals: Good ADL Goals Pt Will Perform Lower Body Dressing: with supervision;with caregiver independent in assisting;sit to/from stand;with adaptive  equipment Pt Will Perform Tub/Shower Transfer: Tub transfer;with min guard assist;with caregiver independent in assisting;3 in 1;rolling walker;ambulating  Plan Discharge plan remains appropriate    Co-evaluation                 End of Session Equipment Utilized During Treatment: Gait belt;Rolling walker   Activity Tolerance Patient tolerated treatment well   Patient Left in chair;with call bell/phone within reach   Nurse Communication          Time: 1009-1040 OT Time Calculation (min): 31 min  Charges: OT General Charges $OT Visit: 1 Procedure OT Treatments $Self Care/Home Management : 23-37 mins  Lanaysia Fritchman A Victoria Henshaw 11/28/2016, 11:30 AM

## 2017-03-20 IMAGING — DX DG PORTABLE PELVIS
1 series · 1 of 1 positions shown · non-contrast
Comparison: Intraoperative left hip images earlier today at [DATE]
p.m.. Left hip images at the time of therapeutic injection
04/23/2016.

CLINICAL DATA: Postop day 0 anterior approach left total hip
arthroplasty.

EXAM:
PORTABLE PELVIS 1-2 VIEWS

[pelvis ap]
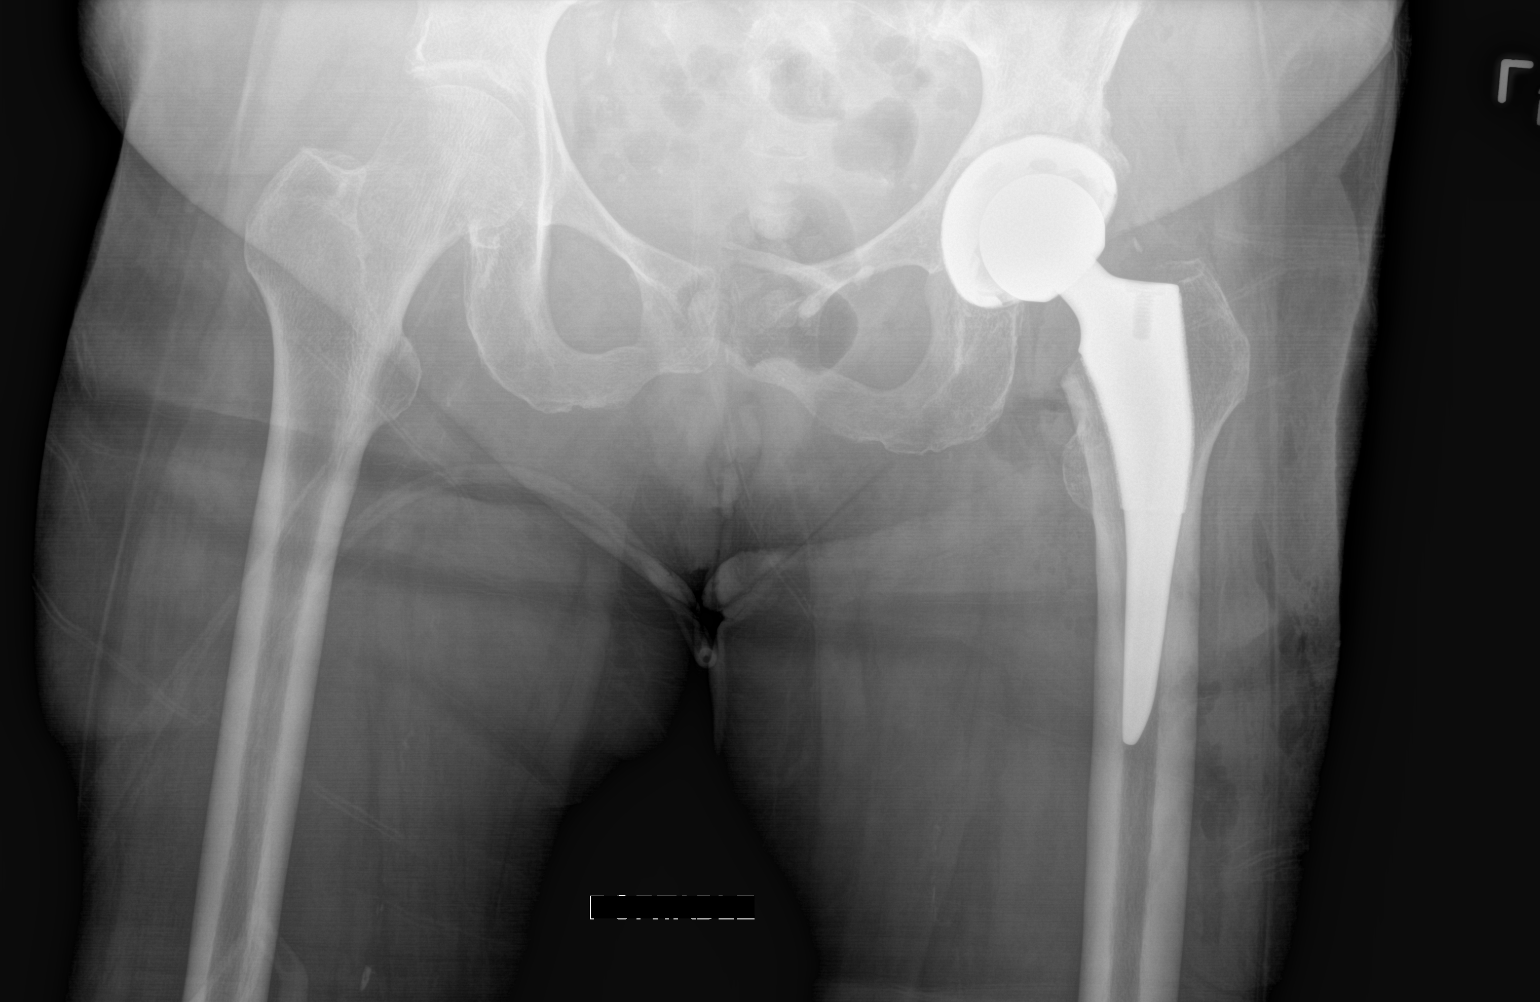

[1 of 1 positions shown; findings below may reference images not displayed]

FINDINGS: Anatomic alignment in the AP projection post left total hip
arthroplasty. No complicating features. Contralateral right hip
joint intact with well-preserved joint space for age.
IMPRESSION: Anatomic alignment in the AP projection post left total hip
arthroplasty without acute complicating features.

## 2017-08-19 ENCOUNTER — Encounter (HOSPITAL_COMMUNITY): Payer: Medicare Other

## 2017-08-19 ENCOUNTER — Ambulatory Visit: Payer: Medicare Other | Admitting: Family

## 2017-10-21 ENCOUNTER — Encounter: Payer: Self-pay | Admitting: Family

## 2017-10-21 ENCOUNTER — Ambulatory Visit (HOSPITAL_COMMUNITY)
Admission: RE | Admit: 2017-10-21 | Discharge: 2017-10-21 | Disposition: A | Discharge status: Home / Self Care | Payer: Medicare Other | Source: Ambulatory Visit | Attending: Surgery | Admitting: Surgery

## 2017-10-21 ENCOUNTER — Ambulatory Visit (INDEPENDENT_AMBULATORY_CARE_PROVIDER_SITE_OTHER): Payer: Medicare Other | Admitting: Family

## 2017-10-21 VITALS — BP 206/99 | HR 58 | Temp 97.5°F | Resp 18 | Ht 62.0 in | Wt 137.0 lb

## 2017-10-21 DIAGNOSIS — I739 Peripheral vascular disease, unspecified: Secondary | ICD-10-CM | POA: Insufficient documentation

## 2017-10-21 DIAGNOSIS — I10 Essential (primary) hypertension: Secondary | ICD-10-CM | POA: Insufficient documentation

## 2017-10-21 DIAGNOSIS — I779 Disorder of arteries and arterioles, unspecified: Secondary | ICD-10-CM

## 2017-10-21 NOTE — Progress Notes (Signed)
[facility]    [facility]: Follow up peripheral artery occlusive disease  History of Present Illness Darlene Torres is a 83 y.o. female who was referred to Dr. Myra GianottiBrabham for evaluation of decreased pulses. Dr. Myra GianottiBrabham initial evaluation of pt was on 08-15-16.  The patient states that she has trouble with ambulation, however this was secondary to left hip and right knee pain.   She had a left total hip replacement in September 2017, and a right total knee replacement in December 2017.  Since the above surgeries she states she is able to walk more, does not seem to indicate claudication sx's.  During the left hip evaluation she was found to be without pedal pulses and therefore she was sent for vascular evaluation.  The patient denies symptoms of claudication.  She does not get any cramping with activity.  She denies rest pain which wakes her up at night.  She denies nonhealing wounds.  The patient suffers from hypertension which is medically managed with an ACE inhibitor.    Dr. Myra GianottiBrabham last evaluated pt on 08-15-16. At that time, the patient despite having decreased ankle brachial indices and nonpalpable pedal pulses, did not have symptoms of vascular insufficiency.  Specifically, she did not endorse claudication symptoms and she did not have nonhealing wounds or lower extremity rest pain.  For that reason Dr. Myra GianottiBrabham recommended medical management of her atherosclerotic disease.  This would include restarting her aspirin.  Dr. Myra GianottiBrabham discussed the possibility of starting a statin, however he deferred to her primary care physician to make this decision, given the patient's advanced age.  Dr. Myra GianottiBrabham indicated no concerns re proceeding with orthopedic surgery of the left hip and left knee.  The patient is at slightly increased risk for incisional complications particularly in the knee, however no vascular intervention would be recommended prior to her operation.  The patient was to  contact Dr. Myra GianottiBrabham if she developed any issues with wounds or progression of her symptoms.  Dr. Myra GianottiBrabham advised follow-up in one year for surveillance with ankle-brachial indices.  Pt Diabetic: No Pt smoker: non-smoker  Pt meds include: Statin :No Betablocker: Yes ASA: Yes Other anticoagulants/antiplatelets: no  Past Medical History:  Diagnosis Date  . Arthritis   . Diverticulitis of colon   . Glaucoma    bilateral  . History of bronchitis   . Hypertension   . Seasonal allergies   . Urinary incontinence   . Urinary urgency     Social History Social History  Substance Use Topics  . Smoking status: Never Smoker  . Smokeless tobacco: Never Used  . Alcohol use No    Family History History reviewed. No pertinent family history.  Past Surgical History:  Procedure Laterality Date  . ABDOMINAL HYSTERECTOMY    . COLONOSCOPY    . DILATION AND CURETTAGE OF UTERUS    . SAVORY DILATION     esophagus stretching with endoscopy  . TOTAL HIP ARTHROPLASTY Left 09/20/2016   Procedure: LEFT TOTAL HIP ARTHROPLASTY ANTERIOR APPROACH;  Surgeon: Samson FredericBrian Swinteck, MD;  Location: [facility];  Service: Orthopedics;  Laterality: Left;  RNFA  . TOTAL KNEE ARTHROPLASTY Right 11/26/2016   Procedure: RIGHT TOTAL KNEE ARTHROPLASTY WITH NAVIGATION COMPLEX;  Surgeon: Samson FredericBrian Swinteck, MD;  Location: [facility];  Service: Orthopedics;  Laterality: Right;    Allergies  Allergen Reactions  . No Known Allergies     Current Outpatient Prescriptions  Medication Sig Dispense Refill  . aspirin 81 MG chewable tablet Chew  1 tablet (81 mg total) by mouth 2 (two) times daily. 60 tablet 1  . cetirizine (ZYRTEC) 10 MG tablet Take 10 mg by mouth daily.     . fluticasone (FLONASE) 50 MCG/ACT nasal spray Place 1-2 sprays into both nostrils daily as needed (for runny nose).    Marland Kitchen latanoprost (XALATAN) 0.005 % ophthalmic solution Place 1 drop into both eyes at bedtime.    Marland Kitchen lisinopril (PRINIVIL,ZESTRIL) 10 MG tablet Take 10  mg by mouth daily.    . meloxicam (MOBIC) 7.5 MG tablet Take 7.5 mg by mouth daily as needed for pain.     Marland Kitchen oxybutynin (DITROPAN-XL) 5 MG 24 hr tablet Take 5 mg by mouth at bedtime as needed (for urinary frequency/incontinence).     . senna (SENOKOT) 8.6 MG TABS tablet Take 2 tablets (17.2 mg total) by mouth at bedtime. 60 each 3  . timolol (TIMOPTIC) 0.5 % ophthalmic solution Place 1 drop into both eyes daily. 10 mL 12  . Vitamin D, Ergocalciferol, (DRISDOL) 50000 units CAPS capsule Take 50,000 Units by mouth every Friday.    . docusate sodium (COLACE) 100 MG capsule Take 1 capsule (100 mg total) by mouth 2 (two) times daily. (Patient not taking: Reported on 11/12/2016) 60 capsule 3  . HYDROcodone-acetaminophen (NORCO/VICODIN) 5-325 MG tablet Take 1-2 tablets by mouth every 4 (four) hours as needed (breakthrough pain). (Patient not taking: Reported on 10/21/2017) 30 tablet 0  . methylcellulose (CITRUCEL) oral powder Take 1 packet by mouth daily as needed (for digestive regularity).    . metoprolol succinate (TOPROL-XL) 25 MG 24 hr tablet Take 25 mg by mouth daily.    . ondansetron (ZOFRAN) 4 MG tablet Take 1 tablet (4 mg total) by mouth every 6 (six) hours as needed for nausea. (Patient not taking: Reported on 11/12/2016) 20 tablet 0  . timolol (BETIMOL) 0.5 % ophthalmic solution Place 1 drop into both eyes daily.      No current facility-administered medications for this visit.     ROS: See HPI for pertinent positives and negatives.   Physical Examination  Vitals:   10/21/17 1448 10/21/17 1451  BP: (!) 197/90 (!) 175/88  Pulse: (!) 58   Resp: 18   Temp: (!) 97.5 F (36.4 C)   TempSrc: Oral   SpO2: 96%   Weight: 137 lb (62.1 kg)   Height: 5\' 2"  (1.575 m)    Body mass index is 25.06 kg/m.  General: A&O x 3, WDWN, female. Gait: slow and deliberate Eyes: PERRLA. Pulmonary: Respirations are non labored, CTAB, good air movement Cardiac: regular Rhythm, no detected murmur.          Carotid Bruits Right Left   Negative Negative   Radial pulses are 2+ palpable bilaterally   Adominal aortic pulse is not palpable                         VASCULAR EXAM: Extremities without ischemic changes, without Gangrene; without open wounds.  LE Pulses Right Left       FEMORAL  2+ palpable  1+ palpable        POPLITEAL  not palpable   not palpable       POSTERIOR TIBIAL  not palpable   not palpable        DORSALIS PEDIS      ANTERIOR TIBIAL not palpable  not palpable    Abdomen: soft, NT, no palpable masses. Skin: no rashes, no ulcers noted. Musculoskeletal: no muscle wasting or atrophy.  Neurologic: A&O X 3; Appropriate Affect ; SENSATION: normal; MOTOR FUNCTION:  moving all extremities equally, motor strength 5/5 throughout. Speech is fluent/normal. CN 2-12 intact.    ASSESSMENT: DEVON KINGDON is a 83 y.o. female who has asymptomatic peripheral artery occlusive disease.  Her walking had been limited by painful left hip and knee; but since both were replaced in 2017 she has been walking more. ABI's have improved bilaterally.   Fortunately she does not have DM and has never used tobacco.  She takes a daily ASA, but is not taking a statin. Consider addition of a statin to reduce her risk of CVD events related to atherosclerosis, if there are no overriding contraindications, defer to her PCP.   I advised pt to stop at her PCP's office for them to recheck and address her uncontrolled hypertension. She and her daughter stated they would prefer to check her blood pressure when she got home; I advised her to notify her PCP if her blood pressure remains greater than 140/90.  Pt denies feeling light headed, denies headache, denies chest pain or dyspnea.   DATA  ABI (Date: 10/21/2017):  R:   ABI: 0.76 (was 0.67 on 07-17-16, [facility]),   PT: mono  DP:  mono  TBI:  0.47  L:   ABI: 0.88 (was 0.73),   PT: mono  DP: mono  TBI: 0.62  Improved bilateral ABI: mild disease in the right, moderate in the left lower extremity; all monophasic waveforms.    PLAN:  Based on the patient's vascular studies and examination, pt will return to clinic in 1 year with ABI's. I advised her and her daughter to notify us if she develops concerns re the circulation in her feet or legs.   I discussed in depth with the patient the nature of atherosclerosis, and emphasized the importance of maximal medical management including strict control of blood pressure, blood glucose, and lipid levels, obtaining regular exercise, and continued cessation of smoking.  The patient is aware that without maximal medical management the underlying atherosclerotic disease process will progress, limiting the benefit of any interventions.  The patient was given information about PAD including signs, symptoms, treatment, what symptoms should prompt the patient to seek immediate medical care, and risk reduction measures to take.  Charisse March, RN, MSN, FNP-C [facility] Phone: [phone]  Clinic MD: Myra Gianotti  10/21/17 3:08 PM

## 2017-10-21 NOTE — Patient Instructions (Signed)

## 2017-10-31 NOTE — Addendum Note (Signed)
Addended by: Burton ApleyPETTY, Jedediah Noda A on: 10/31/2017 12:46 PM   Modules accepted: Orders

## 2019-12-31 DIAGNOSIS — Z1231 Encounter for screening mammogram for malignant neoplasm of breast: Secondary | ICD-10-CM | POA: Diagnosis not present

## 2020-02-23 DIAGNOSIS — H25811 Combined forms of age-related cataract, right eye: Secondary | ICD-10-CM | POA: Diagnosis not present

## 2020-02-23 DIAGNOSIS — H401133 Primary open-angle glaucoma, bilateral, severe stage: Secondary | ICD-10-CM | POA: Diagnosis not present

## 2020-02-23 DIAGNOSIS — Z961 Presence of intraocular lens: Secondary | ICD-10-CM | POA: Diagnosis not present

## 2020-03-28 DIAGNOSIS — B372 Candidiasis of skin and nail: Secondary | ICD-10-CM | POA: Diagnosis not present

## 2020-03-28 DIAGNOSIS — M8580 Other specified disorders of bone density and structure, unspecified site: Secondary | ICD-10-CM | POA: Diagnosis not present

## 2020-03-28 DIAGNOSIS — Z1331 Encounter for screening for depression: Secondary | ICD-10-CM | POA: Diagnosis not present

## 2020-03-28 DIAGNOSIS — N189 Chronic kidney disease, unspecified: Secondary | ICD-10-CM | POA: Diagnosis not present

## 2020-03-28 DIAGNOSIS — I10 Essential (primary) hypertension: Secondary | ICD-10-CM | POA: Diagnosis not present

## 2020-03-28 DIAGNOSIS — E559 Vitamin D deficiency, unspecified: Secondary | ICD-10-CM | POA: Diagnosis not present

## 2020-03-28 DIAGNOSIS — M159 Polyosteoarthritis, unspecified: Secondary | ICD-10-CM | POA: Diagnosis not present

## 2020-03-28 DIAGNOSIS — J309 Allergic rhinitis, unspecified: Secondary | ICD-10-CM | POA: Diagnosis not present

## 2020-03-28 DIAGNOSIS — R011 Cardiac murmur, unspecified: Secondary | ICD-10-CM | POA: Diagnosis not present

## 2020-05-30 DIAGNOSIS — Z961 Presence of intraocular lens: Secondary | ICD-10-CM | POA: Diagnosis not present

## 2020-05-30 DIAGNOSIS — H25811 Combined forms of age-related cataract, right eye: Secondary | ICD-10-CM | POA: Diagnosis not present

## 2020-05-30 DIAGNOSIS — H401133 Primary open-angle glaucoma, bilateral, severe stage: Secondary | ICD-10-CM | POA: Diagnosis not present

## 2020-07-13 DIAGNOSIS — H25811 Combined forms of age-related cataract, right eye: Secondary | ICD-10-CM | POA: Diagnosis not present

## 2020-07-13 DIAGNOSIS — Z961 Presence of intraocular lens: Secondary | ICD-10-CM | POA: Diagnosis not present

## 2020-07-13 DIAGNOSIS — H401133 Primary open-angle glaucoma, bilateral, severe stage: Secondary | ICD-10-CM | POA: Diagnosis not present

## 2020-09-27 DIAGNOSIS — R011 Cardiac murmur, unspecified: Secondary | ICD-10-CM | POA: Diagnosis not present

## 2020-09-27 DIAGNOSIS — M859 Disorder of bone density and structure, unspecified: Secondary | ICD-10-CM | POA: Diagnosis not present

## 2020-09-27 DIAGNOSIS — Z1331 Encounter for screening for depression: Secondary | ICD-10-CM | POA: Diagnosis not present

## 2020-09-27 DIAGNOSIS — K219 Gastro-esophageal reflux disease without esophagitis: Secondary | ICD-10-CM | POA: Diagnosis not present

## 2020-09-27 DIAGNOSIS — I10 Essential (primary) hypertension: Secondary | ICD-10-CM | POA: Diagnosis not present

## 2020-09-27 DIAGNOSIS — Z6823 Body mass index (BMI) 23.0-23.9, adult: Secondary | ICD-10-CM | POA: Diagnosis not present

## 2020-09-27 DIAGNOSIS — N189 Chronic kidney disease, unspecified: Secondary | ICD-10-CM | POA: Diagnosis not present

## 2020-09-27 DIAGNOSIS — E559 Vitamin D deficiency, unspecified: Secondary | ICD-10-CM | POA: Diagnosis not present

## 2020-09-27 DIAGNOSIS — M8580 Other specified disorders of bone density and structure, unspecified site: Secondary | ICD-10-CM | POA: Diagnosis not present

## 2020-09-27 DIAGNOSIS — Z23 Encounter for immunization: Secondary | ICD-10-CM | POA: Diagnosis not present

## 2020-10-28 DIAGNOSIS — Z0000 Encounter for general adult medical examination without abnormal findings: Secondary | ICD-10-CM | POA: Diagnosis not present

## 2020-10-28 DIAGNOSIS — N189 Chronic kidney disease, unspecified: Secondary | ICD-10-CM | POA: Diagnosis not present

## 2020-10-28 DIAGNOSIS — E559 Vitamin D deficiency, unspecified: Secondary | ICD-10-CM | POA: Diagnosis not present

## 2020-10-28 DIAGNOSIS — M8580 Other specified disorders of bone density and structure, unspecified site: Secondary | ICD-10-CM | POA: Diagnosis not present

## 2020-10-28 DIAGNOSIS — I10 Essential (primary) hypertension: Secondary | ICD-10-CM | POA: Diagnosis not present

## 2020-10-28 DIAGNOSIS — Z6826 Body mass index (BMI) 26.0-26.9, adult: Secondary | ICD-10-CM | POA: Diagnosis not present

## 2020-11-11 DIAGNOSIS — H401133 Primary open-angle glaucoma, bilateral, severe stage: Secondary | ICD-10-CM | POA: Diagnosis not present

## 2020-11-11 DIAGNOSIS — Z961 Presence of intraocular lens: Secondary | ICD-10-CM | POA: Diagnosis not present

## 2020-11-11 DIAGNOSIS — H25811 Combined forms of age-related cataract, right eye: Secondary | ICD-10-CM | POA: Diagnosis not present

## 2020-11-30 DIAGNOSIS — E559 Vitamin D deficiency, unspecified: Secondary | ICD-10-CM | POA: Diagnosis not present

## 2020-11-30 DIAGNOSIS — N189 Chronic kidney disease, unspecified: Secondary | ICD-10-CM | POA: Diagnosis not present

## 2020-11-30 DIAGNOSIS — Z6823 Body mass index (BMI) 23.0-23.9, adult: Secondary | ICD-10-CM | POA: Diagnosis not present

## 2020-11-30 DIAGNOSIS — I10 Essential (primary) hypertension: Secondary | ICD-10-CM | POA: Diagnosis not present

## 2020-11-30 DIAGNOSIS — K219 Gastro-esophageal reflux disease without esophagitis: Secondary | ICD-10-CM | POA: Diagnosis not present

## 2020-11-30 DIAGNOSIS — J309 Allergic rhinitis, unspecified: Secondary | ICD-10-CM | POA: Diagnosis not present

## 2021-05-02 DIAGNOSIS — E559 Vitamin D deficiency, unspecified: Secondary | ICD-10-CM | POA: Diagnosis not present

## 2021-05-02 DIAGNOSIS — M159 Polyosteoarthritis, unspecified: Secondary | ICD-10-CM | POA: Diagnosis not present

## 2021-05-02 DIAGNOSIS — M7551 Bursitis of right shoulder: Secondary | ICD-10-CM | POA: Diagnosis not present

## 2021-05-02 DIAGNOSIS — K219 Gastro-esophageal reflux disease without esophagitis: Secondary | ICD-10-CM | POA: Diagnosis not present

## 2021-05-02 DIAGNOSIS — I10 Essential (primary) hypertension: Secondary | ICD-10-CM | POA: Diagnosis not present

## 2021-05-02 DIAGNOSIS — Z6824 Body mass index (BMI) 24.0-24.9, adult: Secondary | ICD-10-CM | POA: Diagnosis not present

## 2021-05-02 DIAGNOSIS — N1831 Chronic kidney disease, stage 3a: Secondary | ICD-10-CM | POA: Diagnosis not present

## 2021-05-10 DIAGNOSIS — Z961 Presence of intraocular lens: Secondary | ICD-10-CM | POA: Diagnosis not present

## 2021-05-10 DIAGNOSIS — H25811 Combined forms of age-related cataract, right eye: Secondary | ICD-10-CM | POA: Diagnosis not present

## 2021-05-10 DIAGNOSIS — H401133 Primary open-angle glaucoma, bilateral, severe stage: Secondary | ICD-10-CM | POA: Diagnosis not present

## 2021-06-05 DIAGNOSIS — M79671 Pain in right foot: Secondary | ICD-10-CM | POA: Diagnosis not present

## 2021-06-05 DIAGNOSIS — L03115 Cellulitis of right lower limb: Secondary | ICD-10-CM | POA: Diagnosis not present

## 2021-08-25 DIAGNOSIS — H409 Unspecified glaucoma: Secondary | ICD-10-CM | POA: Diagnosis not present

## 2021-08-25 DIAGNOSIS — I25119 Atherosclerotic heart disease of native coronary artery with unspecified angina pectoris: Secondary | ICD-10-CM | POA: Diagnosis not present

## 2021-08-25 DIAGNOSIS — G8929 Other chronic pain: Secondary | ICD-10-CM | POA: Diagnosis not present

## 2021-08-25 DIAGNOSIS — M1990 Unspecified osteoarthritis, unspecified site: Secondary | ICD-10-CM | POA: Diagnosis not present

## 2021-08-25 DIAGNOSIS — K219 Gastro-esophageal reflux disease without esophagitis: Secondary | ICD-10-CM | POA: Diagnosis not present

## 2021-08-25 DIAGNOSIS — I129 Hypertensive chronic kidney disease with stage 1 through stage 4 chronic kidney disease, or unspecified chronic kidney disease: Secondary | ICD-10-CM | POA: Diagnosis not present

## 2021-08-25 DIAGNOSIS — I951 Orthostatic hypotension: Secondary | ICD-10-CM | POA: Diagnosis not present

## 2021-08-25 DIAGNOSIS — M8580 Other specified disorders of bone density and structure, unspecified site: Secondary | ICD-10-CM | POA: Diagnosis not present

## 2021-08-25 DIAGNOSIS — J302 Other seasonal allergic rhinitis: Secondary | ICD-10-CM | POA: Diagnosis not present

## 2021-10-13 DIAGNOSIS — H401133 Primary open-angle glaucoma, bilateral, severe stage: Secondary | ICD-10-CM | POA: Diagnosis not present

## 2021-10-13 DIAGNOSIS — Z961 Presence of intraocular lens: Secondary | ICD-10-CM | POA: Diagnosis not present

## 2021-10-13 DIAGNOSIS — H25811 Combined forms of age-related cataract, right eye: Secondary | ICD-10-CM | POA: Diagnosis not present

## 2021-11-06 DIAGNOSIS — Z0000 Encounter for general adult medical examination without abnormal findings: Secondary | ICD-10-CM | POA: Diagnosis not present

## 2021-11-06 DIAGNOSIS — N1831 Chronic kidney disease, stage 3a: Secondary | ICD-10-CM | POA: Diagnosis not present

## 2021-11-06 DIAGNOSIS — K219 Gastro-esophageal reflux disease without esophagitis: Secondary | ICD-10-CM | POA: Diagnosis not present

## 2021-11-06 DIAGNOSIS — E559 Vitamin D deficiency, unspecified: Secondary | ICD-10-CM | POA: Diagnosis not present

## 2021-11-06 DIAGNOSIS — R011 Cardiac murmur, unspecified: Secondary | ICD-10-CM | POA: Diagnosis not present

## 2021-11-06 DIAGNOSIS — M8580 Other specified disorders of bone density and structure, unspecified site: Secondary | ICD-10-CM | POA: Diagnosis not present

## 2021-11-06 DIAGNOSIS — M159 Polyosteoarthritis, unspecified: Secondary | ICD-10-CM | POA: Diagnosis not present

## 2021-11-06 DIAGNOSIS — Z23 Encounter for immunization: Secondary | ICD-10-CM | POA: Diagnosis not present

## 2021-11-06 DIAGNOSIS — I10 Essential (primary) hypertension: Secondary | ICD-10-CM | POA: Diagnosis not present

## 2021-12-28 DIAGNOSIS — I10 Essential (primary) hypertension: Secondary | ICD-10-CM | POA: Diagnosis not present

## 2021-12-28 DIAGNOSIS — G8929 Other chronic pain: Secondary | ICD-10-CM | POA: Diagnosis not present

## 2021-12-28 DIAGNOSIS — K219 Gastro-esophageal reflux disease without esophagitis: Secondary | ICD-10-CM | POA: Diagnosis not present

## 2021-12-28 DIAGNOSIS — Z96649 Presence of unspecified artificial hip joint: Secondary | ICD-10-CM | POA: Diagnosis not present

## 2021-12-28 DIAGNOSIS — M1990 Unspecified osteoarthritis, unspecified site: Secondary | ICD-10-CM | POA: Diagnosis not present

## 2021-12-28 DIAGNOSIS — J302 Other seasonal allergic rhinitis: Secondary | ICD-10-CM | POA: Diagnosis not present

## 2021-12-28 DIAGNOSIS — Z833 Family history of diabetes mellitus: Secondary | ICD-10-CM | POA: Diagnosis not present

## 2021-12-28 DIAGNOSIS — H409 Unspecified glaucoma: Secondary | ICD-10-CM | POA: Diagnosis not present

## 2021-12-28 DIAGNOSIS — I739 Peripheral vascular disease, unspecified: Secondary | ICD-10-CM | POA: Diagnosis not present

## 2022-02-13 DIAGNOSIS — H35033 Hypertensive retinopathy, bilateral: Secondary | ICD-10-CM | POA: Diagnosis not present

## 2022-02-13 DIAGNOSIS — Z961 Presence of intraocular lens: Secondary | ICD-10-CM | POA: Diagnosis not present

## 2022-02-13 DIAGNOSIS — H401133 Primary open-angle glaucoma, bilateral, severe stage: Secondary | ICD-10-CM | POA: Diagnosis not present

## 2022-02-13 DIAGNOSIS — H25811 Combined forms of age-related cataract, right eye: Secondary | ICD-10-CM | POA: Diagnosis not present

## 2022-04-03 DIAGNOSIS — H2511 Age-related nuclear cataract, right eye: Secondary | ICD-10-CM | POA: Diagnosis not present

## 2022-04-03 DIAGNOSIS — H401113 Primary open-angle glaucoma, right eye, severe stage: Secondary | ICD-10-CM | POA: Diagnosis not present

## 2022-05-07 DIAGNOSIS — M67911 Unspecified disorder of synovium and tendon, right shoulder: Secondary | ICD-10-CM | POA: Diagnosis not present

## 2022-05-07 DIAGNOSIS — R011 Cardiac murmur, unspecified: Secondary | ICD-10-CM | POA: Diagnosis not present

## 2022-05-07 DIAGNOSIS — Z6824 Body mass index (BMI) 24.0-24.9, adult: Secondary | ICD-10-CM | POA: Diagnosis not present

## 2022-05-07 DIAGNOSIS — M8580 Other specified disorders of bone density and structure, unspecified site: Secondary | ICD-10-CM | POA: Diagnosis not present

## 2022-05-07 DIAGNOSIS — E559 Vitamin D deficiency, unspecified: Secondary | ICD-10-CM | POA: Diagnosis not present

## 2022-05-07 DIAGNOSIS — M159 Polyosteoarthritis, unspecified: Secondary | ICD-10-CM | POA: Diagnosis not present

## 2022-05-07 DIAGNOSIS — N1831 Chronic kidney disease, stage 3a: Secondary | ICD-10-CM | POA: Diagnosis not present

## 2022-05-07 DIAGNOSIS — I10 Essential (primary) hypertension: Secondary | ICD-10-CM | POA: Diagnosis not present

## 2022-05-07 DIAGNOSIS — K219 Gastro-esophageal reflux disease without esophagitis: Secondary | ICD-10-CM | POA: Diagnosis not present

## 2022-05-15 DIAGNOSIS — G8929 Other chronic pain: Secondary | ICD-10-CM | POA: Diagnosis not present

## 2022-05-15 DIAGNOSIS — M25511 Pain in right shoulder: Secondary | ICD-10-CM | POA: Diagnosis not present

## 2022-09-17 DIAGNOSIS — Z961 Presence of intraocular lens: Secondary | ICD-10-CM | POA: Diagnosis not present

## 2022-09-17 DIAGNOSIS — H35033 Hypertensive retinopathy, bilateral: Secondary | ICD-10-CM | POA: Diagnosis not present

## 2022-09-17 DIAGNOSIS — H401133 Primary open-angle glaucoma, bilateral, severe stage: Secondary | ICD-10-CM | POA: Diagnosis not present

## 2022-11-09 DIAGNOSIS — Z79899 Other long term (current) drug therapy: Secondary | ICD-10-CM | POA: Diagnosis not present

## 2022-11-09 DIAGNOSIS — Z23 Encounter for immunization: Secondary | ICD-10-CM | POA: Diagnosis not present

## 2022-11-09 DIAGNOSIS — M159 Polyosteoarthritis, unspecified: Secondary | ICD-10-CM | POA: Diagnosis not present

## 2022-11-09 DIAGNOSIS — G609 Hereditary and idiopathic neuropathy, unspecified: Secondary | ICD-10-CM | POA: Diagnosis not present

## 2022-11-09 DIAGNOSIS — Z0000 Encounter for general adult medical examination without abnormal findings: Secondary | ICD-10-CM | POA: Diagnosis not present

## 2022-11-09 DIAGNOSIS — K219 Gastro-esophageal reflux disease without esophagitis: Secondary | ICD-10-CM | POA: Diagnosis not present

## 2022-11-09 DIAGNOSIS — I10 Essential (primary) hypertension: Secondary | ICD-10-CM | POA: Diagnosis not present

## 2022-11-09 DIAGNOSIS — H409 Unspecified glaucoma: Secondary | ICD-10-CM | POA: Diagnosis not present

## 2022-11-09 DIAGNOSIS — N1831 Chronic kidney disease, stage 3a: Secondary | ICD-10-CM | POA: Diagnosis not present

## 2022-11-19 DIAGNOSIS — E538 Deficiency of other specified B group vitamins: Secondary | ICD-10-CM | POA: Diagnosis not present

## 2022-11-27 DIAGNOSIS — E538 Deficiency of other specified B group vitamins: Secondary | ICD-10-CM | POA: Diagnosis not present

## 2022-12-05 DIAGNOSIS — D519 Vitamin B12 deficiency anemia, unspecified: Secondary | ICD-10-CM | POA: Diagnosis not present

## 2022-12-11 DIAGNOSIS — E538 Deficiency of other specified B group vitamins: Secondary | ICD-10-CM | POA: Diagnosis not present

## 2023-01-11 DIAGNOSIS — D519 Vitamin B12 deficiency anemia, unspecified: Secondary | ICD-10-CM | POA: Diagnosis not present

## 2023-01-21 DIAGNOSIS — H401133 Primary open-angle glaucoma, bilateral, severe stage: Secondary | ICD-10-CM | POA: Diagnosis not present

## 2023-01-21 DIAGNOSIS — H35033 Hypertensive retinopathy, bilateral: Secondary | ICD-10-CM | POA: Diagnosis not present

## 2023-01-21 DIAGNOSIS — Z961 Presence of intraocular lens: Secondary | ICD-10-CM | POA: Diagnosis not present

## 2023-02-11 DIAGNOSIS — I10 Essential (primary) hypertension: Secondary | ICD-10-CM | POA: Diagnosis not present

## 2023-02-11 DIAGNOSIS — N1831 Chronic kidney disease, stage 3a: Secondary | ICD-10-CM | POA: Diagnosis not present

## 2023-02-11 DIAGNOSIS — M8580 Other specified disorders of bone density and structure, unspecified site: Secondary | ICD-10-CM | POA: Diagnosis not present

## 2023-02-11 DIAGNOSIS — E559 Vitamin D deficiency, unspecified: Secondary | ICD-10-CM | POA: Diagnosis not present

## 2023-02-11 DIAGNOSIS — R011 Cardiac murmur, unspecified: Secondary | ICD-10-CM | POA: Diagnosis not present

## 2023-02-11 DIAGNOSIS — G609 Hereditary and idiopathic neuropathy, unspecified: Secondary | ICD-10-CM | POA: Diagnosis not present

## 2023-02-11 DIAGNOSIS — E538 Deficiency of other specified B group vitamins: Secondary | ICD-10-CM | POA: Diagnosis not present

## 2023-02-11 DIAGNOSIS — K219 Gastro-esophageal reflux disease without esophagitis: Secondary | ICD-10-CM | POA: Diagnosis not present

## 2023-02-11 DIAGNOSIS — M159 Polyosteoarthritis, unspecified: Secondary | ICD-10-CM | POA: Diagnosis not present

## 2023-04-09 DIAGNOSIS — M25571 Pain in right ankle and joints of right foot: Secondary | ICD-10-CM | POA: Diagnosis not present

## 2023-04-18 DIAGNOSIS — E538 Deficiency of other specified B group vitamins: Secondary | ICD-10-CM | POA: Diagnosis not present

## 2023-04-18 DIAGNOSIS — M159 Polyosteoarthritis, unspecified: Secondary | ICD-10-CM | POA: Diagnosis not present

## 2023-04-18 DIAGNOSIS — M25571 Pain in right ankle and joints of right foot: Secondary | ICD-10-CM | POA: Diagnosis not present

## 2023-04-18 DIAGNOSIS — Z1339 Encounter for screening examination for other mental health and behavioral disorders: Secondary | ICD-10-CM | POA: Diagnosis not present

## 2023-04-18 DIAGNOSIS — Z6824 Body mass index (BMI) 24.0-24.9, adult: Secondary | ICD-10-CM | POA: Diagnosis not present

## 2023-04-18 DIAGNOSIS — M25471 Effusion, right ankle: Secondary | ICD-10-CM | POA: Diagnosis not present

## 2023-05-13 DIAGNOSIS — M159 Polyosteoarthritis, unspecified: Secondary | ICD-10-CM | POA: Diagnosis not present

## 2023-05-13 DIAGNOSIS — G609 Hereditary and idiopathic neuropathy, unspecified: Secondary | ICD-10-CM | POA: Diagnosis not present

## 2023-05-13 DIAGNOSIS — N1831 Chronic kidney disease, stage 3a: Secondary | ICD-10-CM | POA: Diagnosis not present

## 2023-05-13 DIAGNOSIS — D519 Vitamin B12 deficiency anemia, unspecified: Secondary | ICD-10-CM | POA: Diagnosis not present

## 2023-05-13 DIAGNOSIS — I10 Essential (primary) hypertension: Secondary | ICD-10-CM | POA: Diagnosis not present

## 2023-05-13 DIAGNOSIS — K219 Gastro-esophageal reflux disease without esophagitis: Secondary | ICD-10-CM | POA: Diagnosis not present

## 2023-05-13 DIAGNOSIS — M8580 Other specified disorders of bone density and structure, unspecified site: Secondary | ICD-10-CM | POA: Diagnosis not present

## 2023-05-13 DIAGNOSIS — Z1339 Encounter for screening examination for other mental health and behavioral disorders: Secondary | ICD-10-CM | POA: Diagnosis not present

## 2023-05-16 DIAGNOSIS — M810 Age-related osteoporosis without current pathological fracture: Secondary | ICD-10-CM | POA: Diagnosis not present

## 2023-05-16 DIAGNOSIS — K219 Gastro-esophageal reflux disease without esophagitis: Secondary | ICD-10-CM | POA: Diagnosis not present

## 2023-05-16 DIAGNOSIS — J309 Allergic rhinitis, unspecified: Secondary | ICD-10-CM | POA: Diagnosis not present

## 2023-05-16 DIAGNOSIS — R011 Cardiac murmur, unspecified: Secondary | ICD-10-CM | POA: Diagnosis not present

## 2023-05-16 DIAGNOSIS — H409 Unspecified glaucoma: Secondary | ICD-10-CM | POA: Diagnosis not present

## 2023-05-16 DIAGNOSIS — I10 Essential (primary) hypertension: Secondary | ICD-10-CM | POA: Diagnosis not present

## 2023-05-16 DIAGNOSIS — M1990 Unspecified osteoarthritis, unspecified site: Secondary | ICD-10-CM | POA: Diagnosis not present

## 2023-05-16 DIAGNOSIS — G629 Polyneuropathy, unspecified: Secondary | ICD-10-CM | POA: Diagnosis not present

## 2023-05-22 DIAGNOSIS — H401133 Primary open-angle glaucoma, bilateral, severe stage: Secondary | ICD-10-CM | POA: Diagnosis not present

## 2023-05-22 DIAGNOSIS — H35033 Hypertensive retinopathy, bilateral: Secondary | ICD-10-CM | POA: Diagnosis not present

## 2023-05-22 DIAGNOSIS — Z961 Presence of intraocular lens: Secondary | ICD-10-CM | POA: Diagnosis not present

## 2023-07-19 DIAGNOSIS — N1831 Chronic kidney disease, stage 3a: Secondary | ICD-10-CM | POA: Diagnosis not present

## 2023-07-19 DIAGNOSIS — D519 Vitamin B12 deficiency anemia, unspecified: Secondary | ICD-10-CM | POA: Diagnosis not present

## 2023-09-25 DIAGNOSIS — H401133 Primary open-angle glaucoma, bilateral, severe stage: Secondary | ICD-10-CM | POA: Diagnosis not present

## 2023-09-25 DIAGNOSIS — Z961 Presence of intraocular lens: Secondary | ICD-10-CM | POA: Diagnosis not present

## 2023-09-25 DIAGNOSIS — H35033 Hypertensive retinopathy, bilateral: Secondary | ICD-10-CM | POA: Diagnosis not present

## 2023-09-26 DIAGNOSIS — M19011 Primary osteoarthritis, right shoulder: Secondary | ICD-10-CM | POA: Diagnosis not present

## 2023-11-13 DIAGNOSIS — Z23 Encounter for immunization: Secondary | ICD-10-CM | POA: Diagnosis not present

## 2023-11-13 DIAGNOSIS — D519 Vitamin B12 deficiency anemia, unspecified: Secondary | ICD-10-CM | POA: Diagnosis not present

## 2023-11-13 DIAGNOSIS — I10 Essential (primary) hypertension: Secondary | ICD-10-CM | POA: Diagnosis not present

## 2023-11-13 DIAGNOSIS — G609 Hereditary and idiopathic neuropathy, unspecified: Secondary | ICD-10-CM | POA: Diagnosis not present

## 2023-11-13 DIAGNOSIS — M159 Polyosteoarthritis, unspecified: Secondary | ICD-10-CM | POA: Diagnosis not present

## 2023-11-13 DIAGNOSIS — K219 Gastro-esophageal reflux disease without esophagitis: Secondary | ICD-10-CM | POA: Diagnosis not present

## 2023-11-13 DIAGNOSIS — M8580 Other specified disorders of bone density and structure, unspecified site: Secondary | ICD-10-CM | POA: Diagnosis not present

## 2023-11-13 DIAGNOSIS — Z0000 Encounter for general adult medical examination without abnormal findings: Secondary | ICD-10-CM | POA: Diagnosis not present

## 2023-11-13 DIAGNOSIS — N1831 Chronic kidney disease, stage 3a: Secondary | ICD-10-CM | POA: Diagnosis not present

## 2024-02-03 DIAGNOSIS — H35033 Hypertensive retinopathy, bilateral: Secondary | ICD-10-CM | POA: Diagnosis not present

## 2024-02-03 DIAGNOSIS — H401133 Primary open-angle glaucoma, bilateral, severe stage: Secondary | ICD-10-CM | POA: Diagnosis not present

## 2024-02-03 DIAGNOSIS — Z961 Presence of intraocular lens: Secondary | ICD-10-CM | POA: Diagnosis not present

## 2024-02-29 DIAGNOSIS — R059 Cough, unspecified: Secondary | ICD-10-CM | POA: Diagnosis not present

## 2024-02-29 DIAGNOSIS — J111 Influenza due to unidentified influenza virus with other respiratory manifestations: Secondary | ICD-10-CM | POA: Diagnosis not present

## 2024-03-12 DIAGNOSIS — Z6823 Body mass index (BMI) 23.0-23.9, adult: Secondary | ICD-10-CM | POA: Diagnosis not present

## 2024-03-12 DIAGNOSIS — N1831 Chronic kidney disease, stage 3a: Secondary | ICD-10-CM | POA: Diagnosis not present

## 2024-03-12 DIAGNOSIS — G609 Hereditary and idiopathic neuropathy, unspecified: Secondary | ICD-10-CM | POA: Diagnosis not present

## 2024-03-12 DIAGNOSIS — M8580 Other specified disorders of bone density and structure, unspecified site: Secondary | ICD-10-CM | POA: Diagnosis not present

## 2024-03-12 DIAGNOSIS — K219 Gastro-esophageal reflux disease without esophagitis: Secondary | ICD-10-CM | POA: Diagnosis not present

## 2024-03-12 DIAGNOSIS — E559 Vitamin D deficiency, unspecified: Secondary | ICD-10-CM | POA: Diagnosis not present

## 2024-03-12 DIAGNOSIS — M159 Polyosteoarthritis, unspecified: Secondary | ICD-10-CM | POA: Diagnosis not present

## 2024-03-12 DIAGNOSIS — D519 Vitamin B12 deficiency anemia, unspecified: Secondary | ICD-10-CM | POA: Diagnosis not present

## 2024-03-12 DIAGNOSIS — I10 Essential (primary) hypertension: Secondary | ICD-10-CM | POA: Diagnosis not present

## 2024-04-22 DIAGNOSIS — M19011 Primary osteoarthritis, right shoulder: Secondary | ICD-10-CM | POA: Diagnosis not present

## 2024-04-22 DIAGNOSIS — S92302A Fracture of unspecified metatarsal bone(s), left foot, initial encounter for closed fracture: Secondary | ICD-10-CM | POA: Diagnosis not present

## 2024-05-21 DIAGNOSIS — S92302A Fracture of unspecified metatarsal bone(s), left foot, initial encounter for closed fracture: Secondary | ICD-10-CM | POA: Diagnosis not present

## 2024-06-03 DIAGNOSIS — Z961 Presence of intraocular lens: Secondary | ICD-10-CM | POA: Diagnosis not present

## 2024-06-03 DIAGNOSIS — H401133 Primary open-angle glaucoma, bilateral, severe stage: Secondary | ICD-10-CM | POA: Diagnosis not present

## 2024-06-03 DIAGNOSIS — H35033 Hypertensive retinopathy, bilateral: Secondary | ICD-10-CM | POA: Diagnosis not present

## 2024-06-12 DIAGNOSIS — M159 Polyosteoarthritis, unspecified: Secondary | ICD-10-CM | POA: Diagnosis not present

## 2024-06-12 DIAGNOSIS — D519 Vitamin B12 deficiency anemia, unspecified: Secondary | ICD-10-CM | POA: Diagnosis not present

## 2024-06-12 DIAGNOSIS — E559 Vitamin D deficiency, unspecified: Secondary | ICD-10-CM | POA: Diagnosis not present

## 2024-06-12 DIAGNOSIS — K219 Gastro-esophageal reflux disease without esophagitis: Secondary | ICD-10-CM | POA: Diagnosis not present

## 2024-06-12 DIAGNOSIS — I10 Essential (primary) hypertension: Secondary | ICD-10-CM | POA: Diagnosis not present

## 2024-06-12 DIAGNOSIS — L309 Dermatitis, unspecified: Secondary | ICD-10-CM | POA: Diagnosis not present

## 2024-06-12 DIAGNOSIS — G609 Hereditary and idiopathic neuropathy, unspecified: Secondary | ICD-10-CM | POA: Diagnosis not present

## 2024-06-12 DIAGNOSIS — N1831 Chronic kidney disease, stage 3a: Secondary | ICD-10-CM | POA: Diagnosis not present

## 2024-06-12 DIAGNOSIS — M8580 Other specified disorders of bone density and structure, unspecified site: Secondary | ICD-10-CM | POA: Diagnosis not present

## 2024-06-12 DIAGNOSIS — S92302A Fracture of unspecified metatarsal bone(s), left foot, initial encounter for closed fracture: Secondary | ICD-10-CM | POA: Diagnosis not present

## 2024-09-11 DIAGNOSIS — M159 Polyosteoarthritis, unspecified: Secondary | ICD-10-CM | POA: Diagnosis not present

## 2024-09-11 DIAGNOSIS — D519 Vitamin B12 deficiency anemia, unspecified: Secondary | ICD-10-CM | POA: Diagnosis not present

## 2024-09-11 DIAGNOSIS — E559 Vitamin D deficiency, unspecified: Secondary | ICD-10-CM | POA: Diagnosis not present

## 2024-09-11 DIAGNOSIS — Z79899 Other long term (current) drug therapy: Secondary | ICD-10-CM | POA: Diagnosis not present

## 2024-09-11 DIAGNOSIS — K219 Gastro-esophageal reflux disease without esophagitis: Secondary | ICD-10-CM | POA: Diagnosis not present

## 2024-09-11 DIAGNOSIS — G609 Hereditary and idiopathic neuropathy, unspecified: Secondary | ICD-10-CM | POA: Diagnosis not present

## 2024-09-11 DIAGNOSIS — I10 Essential (primary) hypertension: Secondary | ICD-10-CM | POA: Diagnosis not present

## 2024-09-11 DIAGNOSIS — Z23 Encounter for immunization: Secondary | ICD-10-CM | POA: Diagnosis not present

## 2024-09-11 DIAGNOSIS — N1831 Chronic kidney disease, stage 3a: Secondary | ICD-10-CM | POA: Diagnosis not present

## 2024-10-12 DIAGNOSIS — H401133 Primary open-angle glaucoma, bilateral, severe stage: Secondary | ICD-10-CM | POA: Diagnosis not present

## 2024-10-12 DIAGNOSIS — H35033 Hypertensive retinopathy, bilateral: Secondary | ICD-10-CM | POA: Diagnosis not present

## 2024-11-06 DIAGNOSIS — E559 Vitamin D deficiency, unspecified: Secondary | ICD-10-CM | POA: Diagnosis not present

## 2024-11-06 DIAGNOSIS — G629 Polyneuropathy, unspecified: Secondary | ICD-10-CM | POA: Diagnosis not present

## 2024-11-06 DIAGNOSIS — J310 Chronic rhinitis: Secondary | ICD-10-CM | POA: Diagnosis not present

## 2024-11-06 DIAGNOSIS — H409 Unspecified glaucoma: Secondary | ICD-10-CM | POA: Diagnosis not present

## 2024-11-06 DIAGNOSIS — I10 Essential (primary) hypertension: Secondary | ICD-10-CM | POA: Diagnosis not present

## 2024-11-06 DIAGNOSIS — R2681 Unsteadiness on feet: Secondary | ICD-10-CM | POA: Diagnosis not present

## 2024-11-06 DIAGNOSIS — K219 Gastro-esophageal reflux disease without esophagitis: Secondary | ICD-10-CM | POA: Diagnosis not present

## 2024-11-06 DIAGNOSIS — H269 Unspecified cataract: Secondary | ICD-10-CM | POA: Diagnosis not present

## 2024-11-06 DIAGNOSIS — M810 Age-related osteoporosis without current pathological fracture: Secondary | ICD-10-CM | POA: Diagnosis not present
